# Patient Record
Sex: Female | Born: 1990 | Race: Black or African American | Hispanic: No | Marital: Single | Smoking: Never smoker
Health system: Southern US, Community
[De-identification: ages and names within clinical notes are randomized; demographics above are authoritative.]

## PROBLEM LIST (undated history)

## (undated) DIAGNOSIS — Z789 Other specified health status: Secondary | ICD-10-CM

## (undated) HISTORY — DX: Other specified health status: Z78.9

## (undated) HISTORY — PX: ADENOIDECTOMY: SUR15

---

## 2015-06-23 ENCOUNTER — Emergency Department (HOSPITAL_COMMUNITY)
Admission: EM | Admit: 2015-06-23 | Discharge: 2015-06-23 | Disposition: A | Discharge status: Home / Self Care | Payer: 59 | Attending: Emergency Medicine | Admitting: Emergency Medicine

## 2015-06-23 ENCOUNTER — Encounter (HOSPITAL_COMMUNITY): Payer: Self-pay | Admitting: Emergency Medicine

## 2015-06-23 DIAGNOSIS — O26891 Other specified pregnancy related conditions, first trimester: Secondary | ICD-10-CM | POA: Insufficient documentation

## 2015-06-23 DIAGNOSIS — R109 Unspecified abdominal pain: Secondary | ICD-10-CM | POA: Diagnosis not present

## 2015-06-23 DIAGNOSIS — Z3A01 Less than 8 weeks gestation of pregnancy: Secondary | ICD-10-CM | POA: Diagnosis not present

## 2015-06-23 DIAGNOSIS — O211 Hyperemesis gravidarum with metabolic disturbance: Secondary | ICD-10-CM | POA: Insufficient documentation

## 2015-06-23 DIAGNOSIS — O210 Mild hyperemesis gravidarum: Secondary | ICD-10-CM

## 2015-06-23 DIAGNOSIS — R1110 Vomiting, unspecified: Secondary | ICD-10-CM | POA: Diagnosis present

## 2015-06-23 DIAGNOSIS — E860 Dehydration: Secondary | ICD-10-CM

## 2015-06-23 LAB — URINALYSIS, ROUTINE W REFLEX MICROSCOPIC
BILIRUBIN URINE: NEGATIVE
GLUCOSE, UA: NEGATIVE mg/dL
HGB URINE DIPSTICK: NEGATIVE
Leukocytes, UA: NEGATIVE
Nitrite: NEGATIVE
PROTEIN: NEGATIVE mg/dL
Specific Gravity, Urine: 1.028 (ref 1.005–1.030)
pH: 6 (ref 5.0–8.0)

## 2015-06-23 LAB — COMPREHENSIVE METABOLIC PANEL
ALK PHOS: 49 U/L (ref 38–126)
ALT: 15 U/L (ref 14–54)
AST: 19 U/L (ref 15–41)
Albumin: 4.3 g/dL (ref 3.5–5.0)
Anion gap: 6 (ref 5–15)
BUN: 6 mg/dL (ref 6–20)
CALCIUM: 9.1 mg/dL (ref 8.9–10.3)
CHLORIDE: 103 mmol/L (ref 101–111)
CO2: 24 mmol/L (ref 22–32)
CREATININE: 0.61 mg/dL (ref 0.44–1.00)
Glucose, Bld: 82 mg/dL (ref 65–99)
Potassium: 3.1 mmol/L — ABNORMAL LOW (ref 3.5–5.1)
SODIUM: 133 mmol/L — AB (ref 135–145)
Total Bilirubin: 0.8 mg/dL (ref 0.3–1.2)
Total Protein: 7.6 g/dL (ref 6.5–8.1)

## 2015-06-23 LAB — CBC
HCT: 39.4 % (ref 36.0–46.0)
Hemoglobin: 13.3 g/dL (ref 12.0–15.0)
MCH: 28.1 pg (ref 26.0–34.0)
MCHC: 33.8 g/dL (ref 30.0–36.0)
MCV: 83.3 fL (ref 78.0–100.0)
PLATELETS: 250 10*3/uL (ref 150–400)
RBC: 4.73 MIL/uL (ref 3.87–5.11)
RDW: 13.7 % (ref 11.5–15.5)
WBC: 9.5 10*3/uL (ref 4.0–10.5)

## 2015-06-23 LAB — LIPASE, BLOOD: LIPASE: 14 U/L (ref 11–51)

## 2015-06-23 LAB — I-STAT BETA HCG BLOOD, ED (MC, WL, AP ONLY)

## 2015-06-23 MED ORDER — SODIUM CHLORIDE 0.9 % IV BOLUS (SEPSIS)
1000.0000 mL | Freq: Once | INTRAVENOUS | Status: AC
  Administered 2015-06-23: 1000 mL via INTRAVENOUS

## 2015-06-23 MED ORDER — ONDANSETRON HCL 4 MG PO TABS: 4.0000 mg | ORAL_TABLET | Freq: Four times a day (QID) | ORAL | Status: DC

## 2015-06-23 MED ORDER — GNP PRENATAL VITAMINS 28-0.8 MG PO TABS: 1.0000 | ORAL_TABLET | Freq: Every day | ORAL | Status: AC

## 2015-06-23 MED ORDER — ONDANSETRON HCL 4 MG/2ML IJ SOLN
4.0000 mg | Freq: Once | INTRAMUSCULAR | Status: AC
  Administered 2015-06-23: 4 mg via INTRAVENOUS
  Filled 2015-06-23: qty 2

## 2015-06-23 NOTE — ED Provider Notes (Addendum)
CSN: [id number]     Arrival date & time 06/23/15  1612 History   First MD Initiated Contact with Patient 06/23/15 1837     Chief Complaint  Patient presents with  . Emesis  . Abdominal Pain   PT IS HERE BECAUSE SHE THINKS SHE MAY BE PREGNANT.  THE PT SAID THAT HER LAST PERIOD WAS IN April.  THE PT SAID THAT SHE HAS ABD PAIN ONLY WHEN VOMITING.  THE PT DENIES ANY VAGINAL BLEEDING OR DISCHARGE.  SHE HAS NOT TAKEN ANY HOME PREGNANCY TESTS.  (Consider location/radiation/quality/duration/timing/severity/associated sxs/prior Treatment) Patient is a 25 y.o. female presenting with vomiting and abdominal pain. The history is provided by the patient.  Emesis Severity:  Moderate Timing:  Intermittent Associated symptoms: abdominal pain   Abdominal Pain Associated symptoms: vomiting     History reviewed. No pertinent past medical history. History reviewed. No pertinent past surgical history. No family history on file. Social History  Substance Use Topics  . Smoking status: Never Smoker   . Smokeless tobacco: None  . Alcohol Use: None   OB History    No data available     Review of Systems  Gastrointestinal: Positive for vomiting and abdominal pain.  All other systems reviewed and are negative.     Allergies  Review of patient's allergies indicates no known allergies.  Home Medications   Prior to Admission medications   Medication Sig Start Date End Date Taking? Authorizing Provider  ondansetron (ZOFRAN) 4 MG tablet Take 1 tablet (4 mg total) by mouth every 6 (six) hours. 06/23/15   Jacalyn LefevreJulie Shama Monfils, MD  Prenatal Vit-Fe Fumarate-FA [employer](GNP PRENATAL VITAMINS) 28-0.8 MG TABS Take 1 tablet by mouth daily. 06/23/15   Jacalyn LefevreJulie Vercie Pokorny, MD   BP 100/59 mmHg  Pulse 87  Temp(Src) 98.6 F (37 C) (Oral)  Resp 18  Ht 5' 3.75" (1.619 m)  Wt 143 lb (64.864 kg)  BMI 24.75 kg/m2  SpO2 100%  LMP 04/29/2015 Physical Exam  Constitutional: She is oriented to person, place, and time. She appears  well-developed and well-nourished.  HENT:  Head: Normocephalic and atraumatic.  Right Ear: External ear normal.  Left Ear: External ear normal.  Nose: Nose normal.  Mouth/Throat: Oropharynx is clear and moist.  Eyes: Conjunctivae and EOM are normal. Pupils are equal, round, and reactive to light.  Neck: Normal range of motion. Neck supple.  Cardiovascular: Normal rate, regular rhythm, normal heart sounds and intact distal pulses.   Pulmonary/Chest: Effort normal and breath sounds normal.  Abdominal: Soft. Bowel sounds are normal.  Musculoskeletal: Normal range of motion.  Neurological: She is alert and oriented to person, place, and time.  Skin: Skin is warm and dry.  Psychiatric: She has a normal mood and affect. Her behavior is normal. Judgment and thought content normal.  Nursing note and vitals reviewed.   ED Course  Procedures (including critical care time) Labs Review Labs Reviewed  COMPREHENSIVE METABOLIC PANEL - Abnormal; Notable for the following:    Sodium 133 (*)    Potassium 3.1 (*)    All other components within normal limits  URINALYSIS, ROUTINE W REFLEX MICROSCOPIC (NOT AT [facility]) - Abnormal; Notable for the following:    APPearance CLOUDY (*)    Ketones, ur >80 (*)    All other components within normal limits  I-STAT BETA HCG BLOOD, ED ([facility], [facility], [facility] ONLY) - Abnormal; Notable for the following:    I-stat hCG, quantitative >2000.0 (*)    All other components within normal limits  LIPASE, BLOOD  CBC    Imaging Review No results found. I have personally reviewed and evaluated these images and lab results as part of my medical decision-making.   EKG Interpretation None      MDM  PT IS FEELING MUCH BETTER.  SHE KNOWS TO RETURN IF WORSE.  SHE IS GIVEN A RX FOR PNV AND ZOFRAN.  SHE IS INSTR TO F/U WITH GYN. Final diagnoses:  Hyperemesis gravidarum  Dehydration      Jacalyn Lefevre, MD 06/23/15 2011  Jacalyn Lefevre, MD 07/03/15 [phone]

## 2015-06-23 NOTE — Discharge Instructions (Signed)
Hyperemesis Gravidarum  Hyperemesis gravidarum is a severe form of nausea and vomiting that happens during pregnancy. Hyperemesis is worse than morning sickness. It may cause you to have nausea or vomiting all day for many days. It may keep you from eating and drinking enough food and liquids. Hyperemesis usually occurs during the first half (the first 20 weeks) of pregnancy. It often goes away once a woman is in her second half of pregnancy. However, sometimes hyperemesis continues through an entire pregnancy.   CAUSES   The cause of this condition is not completely known but is thought to be related to changes in the body's hormones when pregnant. It could be from the high level of the pregnancy hormone or an increase in estrogen in the body.   SIGNS AND SYMPTOMS    Severe nausea and vomiting.   Nausea that does not go away.   Vomiting that does not allow you to keep any food down.   Weight loss and body fluid loss (dehydration).   Having no desire to eat or not liking food you have previously enjoyed.  DIAGNOSIS   Your health care provider will do a physical exam and ask you about your symptoms. He or she may also order blood tests and urine tests to make sure something else is not causing the problem.   TREATMENT   You may only need medicine to control the problem. If medicines do not control the nausea and vomiting, you will be treated in the hospital to prevent dehydration, increased acid in the blood (acidosis), weight loss, and changes in the electrolytes in your body that may harm the unborn baby (fetus). You may need IV fluids.   HOME CARE INSTRUCTIONS    Only take over-the-counter or prescription medicines as directed by your health care provider.   Try eating a couple of dry crackers or toast in the morning before getting out of bed.   Avoid foods and smells that upset your stomach.   Avoid fatty and spicy foods.   Eat 5-6 small meals a day.   Do not drink when eating meals. Drink between  meals.   For snacks, eat high-protein foods, such as cheese.   Eat or suck on things that have ginger in them. Ginger helps nausea.   Avoid food preparation. The smell of food can spoil your appetite.   Avoid iron pills and iron in your multivitamins until after 3-4 months of being pregnant. However, consult with your health care provider before stopping any prescribed iron pills.  SEEK MEDICAL CARE IF:    Your abdominal pain increases.   You have a severe headache.   You have vision problems.   You are losing weight.  SEEK IMMEDIATE MEDICAL CARE IF:    You are unable to keep fluids down.   You vomit blood.   You have constant nausea and vomiting.   You have excessive weakness.   You have extreme thirst.   You have dizziness or fainting.   You have a fever or persistent symptoms for more than 2-3 days.   You have a fever and your symptoms suddenly get worse.  MAKE SURE YOU:    Understand these instructions.   Will watch your condition.   Will get help right away if you are not doing well or get worse.     This information is not intended to replace advice given to you by your health care provider. Make sure you discuss any questions you have with   your health care provider.     Document Released: 12/31/2004 Document Revised: 10/21/2012 Document Reviewed: 08/12/2012  [employer]vier Interactive Patient Education 2016 [employer]

## 2015-06-23 NOTE — ED Notes (Signed)
Pt c/o slight abdominal pain and episodes of emesis for approx 2 weeks. Pt states she missed her last period and thinks she could be pregnant.

## 2015-09-13 ENCOUNTER — Encounter: Payer: Self-pay | Admitting: Obstetrics and Gynecology

## 2015-09-13 ENCOUNTER — Ambulatory Visit (INDEPENDENT_AMBULATORY_CARE_PROVIDER_SITE_OTHER): Payer: 59 | Admitting: Obstetrics and Gynecology

## 2015-09-13 DIAGNOSIS — Z3482 Encounter for supervision of other normal pregnancy, second trimester: Secondary | ICD-10-CM

## 2015-09-13 DIAGNOSIS — O0930 Supervision of pregnancy with insufficient antenatal care, unspecified trimester: Secondary | ICD-10-CM | POA: Insufficient documentation

## 2015-09-13 DIAGNOSIS — O0932 Supervision of pregnancy with insufficient antenatal care, second trimester: Secondary | ICD-10-CM | POA: Diagnosis not present

## 2015-09-13 DIAGNOSIS — Z3490 Encounter for supervision of normal pregnancy, unspecified, unspecified trimester: Secondary | ICD-10-CM | POA: Insufficient documentation

## 2015-09-13 NOTE — Progress Notes (Signed)
Patient concerned about nipple piercing and breast feeding. Pt. States she needs dental letter, frequent restroom break note for work.

## 2015-09-13 NOTE — Progress Notes (Signed)
  Subjective:    Anna Bean is a G2P0010 at 1940w3d by exact LMP being seen today for her first obstetrical visit.  Her obstetrical history is significant for first pregnancy and late onset to care at 20 weeks. Patient does intend to breast feed. Pregnancy history fully reviewed.  Patient reports no complaints.  Vitals:   09/13/15 1415  BP: 114/74  Pulse: 85  Temp: 98.1 F (36.7 C)  Weight: 133 lb 11.2 oz (60.6 kg)    HISTORY: OB History  Gravida Para Term Preterm AB Living  2       1    SAB TAB Ectopic Multiple Live Births    1          # Outcome Date GA Lbr Len/2nd Weight Sex Delivery Anes PTL Lv  2 Current           1 TAB 06/2012             Past Medical History:  Diagnosis Date  . Medical history non-contributory    Past Surgical History:  Procedure Laterality Date  . ADENOIDECTOMY     Family History  Problem Relation Age of Onset  . Hypertension Mother   . Hypokalemia Mother      Exam    Uterus:     Pelvic Exam:    Perineum: No Hemorrhoids, Normal Perineum   Vulva: normal   Vagina:  normal mucosa, normal discharge   pH:    Cervix: nulliparous appearance and cervix is closed and long   Adnexa: no mass, fullness, tenderness   Bony Pelvis: gynecoid  System: Breast:  normal appearance, no masses or tenderness   Skin: normal coloration and turgor, no rashes    Neurologic: oriented, no focal deficits   Extremities: normal strength, tone, and muscle mass   HEENT extra ocular movement intact   Mouth/Teeth mucous membranes moist, pharynx normal without lesions and dental hygiene good   Neck supple and no masses   Cardiovascular: regular rate and rhythm   Respiratory:  chest clear, no wheezing, crepitations, rhonchi, normal symmetric air entry   Abdomen: soft, non-tender; bowel sounds normal; no masses,  no organomegaly   Urinary:       Assessment:    Pregnancy: G2P0010 Patient Active Problem List   Diagnosis Date Noted  . Supervision of normal  pregnancy, antepartum 09/13/2015  . Insufficient antepartum care 09/13/2015        Plan:     Initial labs drawn. Prenatal vitamins. Problem list reviewed and updated. Genetic Screening discussed Quad Screen: ordered.  Ultrasound discussed; fetal survey: ordered. Pap smear record requested. Patient states it was normal in 2016  Follow up in 4 weeks. 50% of 30 min visit spent on counseling and coordination of care.     Anna Bean 09/13/2015

## 2015-09-13 NOTE — Patient Instructions (Addendum)
Second Trimester of Pregnancy The second trimester is from week 13 through week 28, months 4 through 6. The second trimester is often a time when you feel your best. Your body has also adjusted to being pregnant, and you begin to feel better physically. Usually, morning sickness has lessened or quit completely, you may have more energy, and you may have an increase in appetite. The second trimester is also a time when the fetus is growing rapidly. At the end of the sixth month, the fetus is about 9 inches long and weighs about 1 pounds. You will likely begin to feel the baby move (quickening) between 18 and 20 weeks of the pregnancy. BODY CHANGES Your body goes through many changes during pregnancy. The changes vary from woman to woman.   Your weight will continue to increase. You will notice your lower abdomen bulging out.  You may begin to get stretch marks on your hips, abdomen, and breasts.  You may develop headaches that can be relieved by medicines approved by your health care provider.  You may urinate more often because the fetus is pressing on your bladder.  You may develop or continue to have heartburn as a result of your pregnancy.  You may develop constipation because certain hormones are causing the muscles that push waste through your intestines to slow down.  You may develop hemorrhoids or swollen, bulging veins (varicose veins).  You may have back pain because of the weight gain and pregnancy hormones relaxing your joints between the bones in your pelvis and as a result of a shift in weight and the muscles that support your balance.  Your breasts will continue to grow and be tender.  Your gums may bleed and may be sensitive to brushing and flossing.  Dark spots or blotches (chloasma, mask of pregnancy) may develop on your face. This will likely fade after the baby is born.  A dark line from your belly button to the pubic area (linea nigra) may appear. This will likely  fade after the baby is born.  You may have changes in your hair. These can include thickening of your hair, rapid growth, and changes in texture. Some women also have hair loss during or after pregnancy, or hair that feels dry or thin. Your hair will most likely return to normal after your baby is born. WHAT TO EXPECT AT YOUR PRENATAL VISITS During a routine prenatal visit:  You will be weighed to make sure you and the fetus are growing normally.  Your blood pressure will be taken.  Your abdomen will be measured to track your baby's growth.  The fetal heartbeat will be listened to.  Any test results from the previous visit will be discussed. Your health care provider may ask you:  How you are feeling.  If you are feeling the baby move.  If you have had any abnormal symptoms, such as leaking fluid, bleeding, severe headaches, or abdominal cramping.  If you are using any tobacco products, including cigarettes, chewing tobacco, and electronic cigarettes.  If you have any questions. Other tests that may be performed during your second trimester include:  Blood tests that check for:  Low iron levels (anemia).  Gestational diabetes (between 24 and 28 weeks).  Rh antibodies.  Urine tests to check for infections, diabetes, or protein in the urine.  An ultrasound to confirm the proper growth and development of the baby.  An amniocentesis to check for possible genetic problems.  Fetal screens for spina bifida   and Down syndrome.  HIV (human immunodeficiency virus) testing. Routine prenatal testing includes screening for HIV, unless you choose not to have this test. HOME CARE INSTRUCTIONS   Avoid all smoking, herbs, alcohol, and unprescribed drugs. These chemicals affect the formation and growth of the baby.  Do not use any tobacco products, including cigarettes, chewing tobacco, and electronic cigarettes. If you need help quitting, ask your health care provider. You may receive  counseling support and other resources to help you quit.  Follow your health care provider's instructions regarding medicine use. There are medicines that are either safe or unsafe to take during pregnancy.  Exercise only as directed by your health care provider. Experiencing uterine cramps is a good sign to stop exercising.  Continue to eat regular, healthy meals.  Wear a good support bra for breast tenderness.  Do not use hot tubs, steam rooms, or saunas.  Wear your seat belt at all times when driving.  Avoid raw meat, uncooked cheese, cat litter boxes, and soil used by cats. These carry germs that can cause birth defects in the baby.  Take your prenatal vitamins.  Take 1500-2000 mg of calcium daily starting at the 20th week of pregnancy until you deliver your baby.  Try taking a stool softener (if your health care provider approves) if you develop constipation. Eat more high-fiber foods, such as fresh vegetables or fruit and whole grains. Drink plenty of fluids to keep your urine clear or pale yellow.  Take warm sitz baths to soothe any pain or discomfort caused by hemorrhoids. Use hemorrhoid cream if your health care provider approves.  If you develop varicose veins, wear support hose. Elevate your feet for 15 minutes, 3-4 times a day. Limit salt in your diet.  Avoid heavy lifting, wear low heel shoes, and practice good posture.  Rest with your legs elevated if you have leg cramps or low back pain.  Visit your dentist if you have not gone yet during your pregnancy. Use a soft toothbrush to brush your teeth and be gentle when you floss.  A sexual relationship may be continued unless your health care provider directs you otherwise.  Continue to go to all your prenatal visits as directed by your health care provider. SEEK MEDICAL CARE IF:   You have dizziness.  You have mild pelvic cramps, pelvic pressure, or nagging pain in the abdominal area.  You have persistent nausea,  vomiting, or diarrhea.  You have a bad smelling vaginal discharge.  You have pain with urination. SEEK IMMEDIATE MEDICAL CARE IF:   You have a fever.  You are leaking fluid from your vagina.  You have spotting or bleeding from your vagina.  You have severe abdominal cramping or pain.  You have rapid weight gain or loss.  You have shortness of breath with chest pain.  You notice sudden or extreme swelling of your face, hands, ankles, feet, or legs.  You have not felt your baby move in over an hour.  You have severe headaches that do not go away with medicine.  You have vision changes.   This information is not intended to replace advice given to you by your health care provider. Make sure you discuss any questions you have with your health care provider.   Document Released: 12/25/2000 Document Revised: 01/21/2014 Document Reviewed: 03/03/2012 Elsevier Interactive Patient Education 2016 [employer]  Contraception Choices Contraception (birth control) is the use of any methods or devices to prevent pregnancy. Below are some methods to   help avoid pregnancy. HORMONAL METHODS   Contraceptive implant. This is a thin, plastic tube containing progesterone hormone. It does not contain estrogen hormone. Your health care provider inserts the tube in the inner part of the upper arm. The tube can remain in place for up to 3 years. After 3 years, the implant must be removed. The implant prevents the ovaries from releasing an egg (ovulation), thickens the cervical mucus to prevent sperm from entering the uterus, and thins the lining of the inside of the uterus.  Progesterone-only injections. These injections are given every 3 months by your health care provider to prevent pregnancy. This synthetic progesterone hormone stops the ovaries from releasing eggs. It also thickens cervical mucus and changes the uterine lining. This makes it harder for sperm to survive in the uterus.  Birth  control pills. These pills contain estrogen and progesterone hormone. They work by preventing the ovaries from releasing eggs (ovulation). They also cause the cervical mucus to thicken, preventing the sperm from entering the uterus. Birth control pills are prescribed by a health care provider.Birth control pills can also be used to treat heavy periods.  Minipill. This type of birth control pill contains only the progesterone hormone. They are taken every day of each month and must be prescribed by your health care provider.  Birth control patch. The patch contains hormones similar to those in birth control pills. It must be changed once a week and is prescribed by a health care provider.  Vaginal ring. The ring contains hormones similar to those in birth control pills. It is left in the vagina for 3 weeks, removed for 1 week, and then a new one is put back in place. The patient must be comfortable inserting and removing the ring from the vagina.A health care provider's prescription is necessary.  Emergency contraception. Emergency contraceptives prevent pregnancy after unprotected sexual intercourse. This pill can be taken right after sex or up to 5 days after unprotected sex. It is most effective the sooner you take the pills after having sexual intercourse. Most emergency contraceptive pills are available without a prescription. Check with your pharmacist. Do not use emergency contraception as your only form of birth control. BARRIER METHODS   Female condom. This is a thin sheath (latex or rubber) that is worn over the penis during sexual intercourse. It can be used with spermicide to increase effectiveness.  Female condom. This is a soft, loose-fitting sheath that is put into the vagina before sexual intercourse.  Diaphragm. This is a soft, latex, dome-shaped barrier that must be fitted by a health care provider. It is inserted into the vagina, along with a spermicidal jelly. It is inserted before  intercourse. The diaphragm should be left in the vagina for 6 to 8 hours after intercourse.  Cervical cap. This is a round, soft, latex or plastic cup that fits over the cervix and must be fitted by a health care provider. The cap can be left in place for up to 48 hours after intercourse.  Sponge. This is a soft, circular piece of polyurethane foam. The sponge has spermicide in it. It is inserted into the vagina after wetting it and before sexual intercourse.  Spermicides. These are chemicals that kill or block sperm from entering the cervix and uterus. They come in the form of creams, jellies, suppositories, foam, or tablets. They do not require a prescription. They are inserted into the vagina with an applicator before having sexual intercourse. The process must be repeated every   time you have sexual intercourse. INTRAUTERINE CONTRACEPTION  Intrauterine device (IUD). This is a T-shaped device that is put in a woman's uterus during a menstrual period to prevent pregnancy. There are 2 types:  Copper IUD. This type of IUD is wrapped in copper wire and is placed inside the uterus. Copper makes the uterus and fallopian tubes produce a fluid that kills sperm. It can stay in place for 10 years.  Hormone IUD. This type of IUD contains the hormone progestin (synthetic progesterone). The hormone thickens the cervical mucus and prevents sperm from entering the uterus, and it also thins the uterine lining to prevent implantation of a fertilized egg. The hormone can weaken or kill the sperm that get into the uterus. It can stay in place for 3-5 years, depending on which type of IUD is used. PERMANENT METHODS OF CONTRACEPTION  Female tubal ligation. This is when the woman's fallopian tubes are surgically sealed, tied, or blocked to prevent the egg from traveling to the uterus.  Hysteroscopic sterilization. This involves placing a small coil or insert into each fallopian tube. Your doctor uses a technique  called hysteroscopy to do the procedure. The device causes scar tissue to form. This results in permanent blockage of the fallopian tubes, so the sperm cannot fertilize the egg. It takes about 3 months after the procedure for the tubes to become blocked. You must use another form of birth control for these 3 months.  Female sterilization. This is when the female has the tubes that carry sperm tied off (vasectomy).This blocks sperm from entering the vagina during sexual intercourse. After the procedure, the man can still ejaculate fluid (semen). NATURAL PLANNING METHODS  Natural family planning. This is not having sexual intercourse or using a barrier method (condom, diaphragm, cervical cap) on days the woman could become pregnant.  Calendar method. This is keeping track of the length of each menstrual cycle and identifying when you are fertile.  Ovulation method. This is avoiding sexual intercourse during ovulation.  Symptothermal method. This is avoiding sexual intercourse during ovulation, using a thermometer and ovulation symptoms.  Post-ovulation method. This is timing sexual intercourse after you have ovulated. Regardless of which type or method of contraception you choose, it is important that you use condoms to protect against the transmission of sexually transmitted infections (STIs). Talk with your health care provider about which form of contraception is most appropriate for you.   This information is not intended to replace advice given to you by your health care provider. Make sure you discuss any questions you have with your health care provider.   Document Released: 12/31/2004 Document Revised: 01/05/2013 Document Reviewed: 06/25/2012 Elsevier Interactive Patient Education 2016 [employer]  Breastfeeding Deciding to breastfeed is one of the best choices you can make for you and your baby. A change in hormones during pregnancy causes your breast tissue to grow and increases the  number and size of your milk ducts. These hormones also allow proteins, sugars, and fats from your blood supply to make breast milk in your milk-producing glands. Hormones prevent breast milk from being released before your baby is born as well as prompt milk flow after birth. Once breastfeeding has begun, thoughts of your baby, as well as his or her sucking or crying, can stimulate the release of milk from your milk-producing glands.  BENEFITS OF BREASTFEEDING For Your Baby  Your first milk (colostrum) helps your baby's digestive system function better.  There are antibodies in your milk that help   your baby fight off infections.  Your baby has a lower incidence of asthma, allergies, and sudden infant death syndrome.  The nutrients in breast milk are better for your baby than infant formulas and are designed uniquely for your baby's needs.  Breast milk improves your baby's brain development.  Your baby is less likely to develop other conditions, such as childhood obesity, asthma, or type 2 diabetes mellitus. For You  Breastfeeding helps to create a very special bond between you and your baby.  Breastfeeding is convenient. Breast milk is always available at the correct temperature and costs nothing.  Breastfeeding helps to burn calories and helps you lose the weight gained during pregnancy.  Breastfeeding makes your uterus contract to its prepregnancy size faster and slows bleeding (lochia) after you give birth.   Breastfeeding helps to lower your risk of developing type 2 diabetes mellitus, osteoporosis, and breast or ovarian cancer later in life. SIGNS THAT YOUR BABY IS HUNGRY Early Signs of Hunger  Increased alertness or activity.  Stretching.  Movement of the head from side to side.  Movement of the head and opening of the mouth when the corner of the mouth or cheek is stroked (rooting).  Increased sucking sounds, smacking lips, cooing, sighing, or squeaking.  Hand-to-mouth  movements.  Increased sucking of fingers or hands. Late Signs of Hunger  Fussing.  Intermittent crying. Extreme Signs of Hunger Signs of extreme hunger will require calming and consoling before your baby will be able to breastfeed successfully. Do not wait for the following signs of extreme hunger to occur before you initiate breastfeeding:  Restlessness.  A loud, strong cry.  Screaming. BREASTFEEDING BASICS Breastfeeding Initiation  Find a comfortable place to sit or lie down, with your neck and back well supported.  Place a pillow or rolled up blanket under your baby to bring him or her to the level of your breast (if you are seated). Nursing pillows are specially designed to help support your arms and your baby while you breastfeed.  Make sure that your baby's abdomen is facing your abdomen.  Gently massage your breast. With your fingertips, massage from your chest wall toward your nipple in a circular motion. This encourages milk flow. You may need to continue this action during the feeding if your milk flows slowly.  Support your breast with 4 fingers underneath and your thumb above your nipple. Make sure your fingers are well away from your nipple and your baby's mouth.  Stroke your baby's lips gently with your finger or nipple.  When your baby's mouth is open wide enough, quickly bring your baby to your breast, placing your entire nipple and as much of the colored area around your nipple (areola) as possible into your baby's mouth.  More areola should be visible above your baby's upper lip than below the lower lip.  Your baby's tongue should be between his or her lower gum and your breast.  Ensure that your baby's mouth is correctly positioned around your nipple (latched). Your baby's lips should create a seal on your breast and be turned out (everted).  It is common for your baby to suck about 2-3 minutes in order to start the flow of breast milk. Latching Teaching  your baby how to latch on to your breast properly is very important. An improper latch can cause nipple pain and decreased milk supply for you and poor weight gain in your baby. Also, if your baby is not latched onto your nipple properly, he   or she may swallow some air during feeding. This can make your baby fussy. Burping your baby when you switch breasts during the feeding can help to get rid of the air. However, teaching your baby to latch on properly is still the best way to prevent fussiness from swallowing air while breastfeeding. Signs that your baby has successfully latched on to your nipple:  Silent tugging or silent sucking, without causing you pain.  Swallowing heard between every 3-4 sucks.  Muscle movement above and in front of his or her ears while sucking. Signs that your baby has not successfully latched on to nipple:  Sucking sounds or smacking sounds from your baby while breastfeeding.  Nipple pain. If you think your baby has not latched on correctly, slip your finger into the corner of your baby's mouth to break the suction and place it between your baby's gums. Attempt breastfeeding initiation again. Signs of Successful Breastfeeding Signs from your baby:  A gradual decrease in the number of sucks or complete cessation of sucking.  Falling asleep.  Relaxation of his or her body.  Retention of a small amount of milk in his or her mouth.  Letting go of your breast by himself or herself. Signs from you:  Breasts that have increased in firmness, weight, and size 1-3 hours after feeding.  Breasts that are softer immediately after breastfeeding.  Increased milk volume, as well as a change in milk consistency and color by the fifth day of breastfeeding.  Nipples that are not sore, cracked, or bleeding. Signs That Your Baby is Getting Enough Milk  Wetting at least 3 diapers in a 24-hour period. The urine should be clear and pale yellow by age 5 days.  At least 3  stools in a 24-hour period by age 5 days. The stool should be soft and yellow.  At least 3 stools in a 24-hour period by age 7 days. The stool should be seedy and yellow.  No loss of weight greater than 10% of birth weight during the first 3 days of age.  Average weight gain of 4-7 ounces (113-198 g) per week after age 4 days.  Consistent daily weight gain by age 5 days, without weight loss after the age of 2 weeks. After a feeding, your baby may spit up a small amount. This is common. BREASTFEEDING FREQUENCY AND DURATION Frequent feeding will help you make more milk and can prevent sore nipples and breast engorgement. Breastfeed when you feel the need to reduce the fullness of your breasts or when your baby shows signs of hunger. This is called "breastfeeding on demand." Avoid introducing a pacifier to your baby while you are working to establish breastfeeding (the first 4-6 weeks after your baby is born). After this time you may choose to use a pacifier. Research has shown that pacifier use during the first year of a baby's life decreases the risk of sudden infant death syndrome (SIDS). Allow your baby to feed on each breast as long as he or she wants. Breastfeed until your baby is finished feeding. When your baby unlatches or falls asleep while feeding from the first breast, offer the second breast. Because newborns are often sleepy in the first few weeks of life, you may need to awaken your baby to get him or her to feed. Breastfeeding times will vary from baby to baby. However, the following rules can serve as a guide to help you ensure that your baby is properly fed:  Newborns (babies 4 weeks   of age or younger) may breastfeed every 1-3 hours.  Newborns should not go longer than 3 hours during the day or 5 hours during the night without breastfeeding.  You should breastfeed your baby a minimum of 8 times in a 24-hour period until you begin to introduce solid foods to your baby at around 6  months of age. BREAST MILK PUMPING Pumping and storing breast milk allows you to ensure that your baby is exclusively fed your breast milk, even at times when you are unable to breastfeed. This is especially important if you are going back to work while you are still breastfeeding or when you are not able to be present during feedings. Your lactation consultant can give you guidelines on how long it is safe to store breast milk. A breast pump is a machine that allows you to pump milk from your breast into a sterile bottle. The pumped breast milk can then be stored in a refrigerator or freezer. Some breast pumps are operated by hand, while others use electricity. Ask your lactation consultant which type will work best for you. Breast pumps can be purchased, but some hospitals and breastfeeding support groups lease breast pumps on a monthly basis. A lactation consultant can teach you how to hand express breast milk, if you prefer not to use a pump. CARING FOR YOUR BREASTS WHILE YOU BREASTFEED Nipples can become dry, cracked, and sore while breastfeeding. The following recommendations can help keep your breasts moisturized and healthy:  Avoid using soap on your nipples.  Wear a supportive bra. Although not required, special nursing bras and tank tops are designed to allow access to your breasts for breastfeeding without taking off your entire bra or top. Avoid wearing underwire-style bras or extremely tight bras.  Air dry your nipples for 3-4minutes after each feeding.  Use only cotton bra pads to absorb leaked breast milk. Leaking of breast milk between feedings is normal.  Use lanolin on your nipples after breastfeeding. Lanolin helps to maintain your skin's normal moisture barrier. If you use pure lanolin, you do not need to wash it off before feeding your baby again. Pure lanolin is not toxic to your baby. You may also hand express a few drops of breast milk and gently massage that milk into your  nipples and allow the milk to air dry. In the first few weeks after giving birth, some women experience extremely full breasts (engorgement). Engorgement can make your breasts feel heavy, warm, and tender to the touch. Engorgement peaks within 3-5 days after you give birth. The following recommendations can help ease engorgement:  Completely empty your breasts while breastfeeding or pumping. You may want to start by applying warm, moist heat (in the shower or with warm water-soaked hand towels) just before feeding or pumping. This increases circulation and helps the milk flow. If your baby does not completely empty your breasts while breastfeeding, pump any extra milk after he or she is finished.  Wear a snug bra (nursing or regular) or tank top for 1-2 days to signal your body to slightly decrease milk production.  Apply ice packs to your breasts, unless this is too uncomfortable for you.  Make sure that your baby is latched on and positioned properly while breastfeeding. If engorgement persists after 48 hours of following these recommendations, contact your health care provider or a lactation consultant. OVERALL HEALTH CARE RECOMMENDATIONS WHILE BREASTFEEDING  Eat healthy foods. Alternate between meals and snacks, eating 3 of each per day. Because what   you eat affects your breast milk, some of the foods may make your baby more irritable than usual. Avoid eating these foods if you are sure that they are negatively affecting your baby.  Drink milk, fruit juice, and water to satisfy your thirst (about 10 glasses a day).  Rest often, relax, and continue to take your prenatal vitamins to prevent fatigue, stress, and anemia.  Continue breast self-awareness checks.  Avoid chewing and smoking tobacco. Chemicals from cigarettes that pass into breast milk and exposure to secondhand smoke may harm your baby.  Avoid alcohol and drug use, including marijuana. Some medicines that may be harmful to your  baby can pass through breast milk. It is important to ask your health care provider before taking any medicine, including all over-the-counter and prescription medicine as well as vitamin and herbal supplements. It is possible to become pregnant while breastfeeding. If birth control is desired, ask your health care provider about options that will be safe for your baby. SEEK MEDICAL CARE IF:  You feel like you want to stop breastfeeding or have become frustrated with breastfeeding.  You have painful breasts or nipples.  Your nipples are cracked or bleeding.  Your breasts are red, tender, or warm.  You have a swollen area on either breast.  You have a fever or chills.  You have nausea or vomiting.  You have drainage other than breast milk from your nipples.  Your breasts do not become full before feedings by the fifth day after you give birth.  You feel sad and depressed.  Your baby is too sleepy to eat well.  Your baby is having trouble sleeping.   Your baby is wetting less than 3 diapers in a 24-hour period.  Your baby has less than 3 stools in a 24-hour period.  Your baby's skin or the white part of his or her eyes becomes yellow.   Your baby is not gaining weight by 5 days of age. SEEK IMMEDIATE MEDICAL CARE IF:  Your baby is overly tired (lethargic) and does not want to wake up and feed.  Your baby develops an unexplained fever.   This information is not intended to replace advice given to you by your health care provider. Make sure you discuss any questions you have with your health care provider.   Document Released: 12/31/2004 Document Revised: 09/21/2014 Document Reviewed: 06/24/2012 Elsevier Interactive Patient Education 2016 Elsevier Inc.  

## 2015-09-15 LAB — URINE CULTURE, OB REFLEX

## 2015-09-15 LAB — GC/CHLAMYDIA PROBE AMP
Chlamydia trachomatis, NAA: NEGATIVE
Neisseria gonorrhoeae by PCR: NEGATIVE

## 2015-09-15 LAB — CULTURE, OB URINE

## 2015-09-21 LAB — AFP, QUAD SCREEN
DIA MOM VALUE: 0.98
DIA VALUE (EIA): 217.4 pg/mL
DSR (BY AGE) 1 IN: 1094
DSR (Second Trimester) 1 IN: 7612
GESTATIONAL AGE AFP: 20.4 wk
MATERNAL AGE AT EDD: 23.3 a
MSAFP Mom: 0.83
MSAFP: 59.2 ng/mL
MSHCG MOM: 0.87
MSHCG: 22098 m[IU]/mL
Osb Risk: 10000
T18 (By Age): 1:4262 {titer}
Test Results:: NEGATIVE
UE3 MOM: 1.19
UE3 VALUE: 2.44 ng/mL
Weight: 134 [lb_av]

## 2015-09-23 LAB — HIV ANTIBODY (ROUTINE TESTING W REFLEX): HIV SCREEN 4TH GENERATION: NONREACTIVE

## 2015-09-23 LAB — PRENATAL PROFILE I(LABCORP)
Antibody Screen: NEGATIVE
BASOS ABS: 0 10*3/uL (ref 0.0–0.2)
BASOS: 0 %
EOS (ABSOLUTE): 0 10*3/uL (ref 0.0–0.4)
EOS: 0 %
HEMATOCRIT: 34.7 % (ref 34.0–46.6)
Hemoglobin: 11.8 g/dL (ref 11.1–15.9)
Hepatitis B Surface Ag: NEGATIVE
IMMATURE GRANS (ABS): 0 10*3/uL (ref 0.0–0.1)
Immature Granulocytes: 0 %
LYMPHS: 20 %
Lymphocytes Absolute: 1.4 10*3/uL (ref 0.7–3.1)
MCH: 29.6 pg (ref 26.6–33.0)
MCHC: 34 g/dL (ref 31.5–35.7)
MCV: 87 fL (ref 79–97)
MONOCYTES: 7 %
Monocytes Absolute: 0.5 10*3/uL (ref 0.1–0.9)
NEUTROS ABS: 4.9 10*3/uL (ref 1.4–7.0)
Neutrophils: 73 %
PLATELETS: 261 10*3/uL (ref 150–379)
RBC: 3.98 x10E6/uL (ref 3.77–5.28)
RDW: 14.4 % (ref 12.3–15.4)
RH TYPE: POSITIVE
RPR Ser Ql: NONREACTIVE
Rubella Antibodies, IGG: 2.28 index (ref >0.99)
WBC: 6.8 10*3/uL (ref 3.4–10.8)

## 2015-09-23 LAB — HEMOGLOBINOPATHY EVALUATION
HGB A: 97.1 % (ref 94.0–98.0)
HGB C: 0 %
HGB S: 0 %
Hemoglobin A2 Quantitation: 2.2 % (ref 0.7–3.1)
Hemoglobin F Quantitation: 0.7 % (ref 0.0–2.0)

## 2015-09-23 LAB — TOXASSURE SELECT 13 (MW), URINE

## 2015-09-23 LAB — CYSTIC FIBROSIS MUTATION 97: GENE DIS ANAL CARRIER INTERP BLD/T-IMP: NOT DETECTED

## 2015-09-23 LAB — VARICELLA ZOSTER ANTIBODY, IGG: VARICELLA: 1009 {index} (ref >165)

## 2015-09-27 ENCOUNTER — Encounter (HOSPITAL_COMMUNITY): Payer: Self-pay | Admitting: Obstetrics and Gynecology

## 2015-10-06 ENCOUNTER — Other Ambulatory Visit: Payer: Self-pay | Admitting: Obstetrics and Gynecology

## 2015-10-06 ENCOUNTER — Ambulatory Visit (HOSPITAL_COMMUNITY)
Admission: RE | Admit: 2015-10-06 | Discharge: 2015-10-06 | Disposition: A | Discharge status: Home / Self Care | Payer: 59 | Source: Ambulatory Visit | Attending: Obstetrics and Gynecology | Admitting: Obstetrics and Gynecology

## 2015-10-06 DIAGNOSIS — Z3482 Encounter for supervision of other normal pregnancy, second trimester: Secondary | ICD-10-CM

## 2015-10-06 DIAGNOSIS — Z3A22 22 weeks gestation of pregnancy: Secondary | ICD-10-CM

## 2015-10-06 DIAGNOSIS — Z3492 Encounter for supervision of normal pregnancy, unspecified, second trimester: Secondary | ICD-10-CM

## 2015-10-06 DIAGNOSIS — Z3A23 23 weeks gestation of pregnancy: Secondary | ICD-10-CM

## 2015-10-06 DIAGNOSIS — Z1389 Encounter for screening for other disorder: Secondary | ICD-10-CM

## 2015-10-06 DIAGNOSIS — Z36 Encounter for antenatal screening of mother: Secondary | ICD-10-CM | POA: Diagnosis present

## 2015-10-06 IMAGING — US US MFM OB COMP +14 WKS
1 series · 14 of 28 positions shown · non-contrast
Comparison: none

[Series 1: us mfm ob comp +14 wks · 86 acquisitions, 14 frames shown]
[im 4/86]
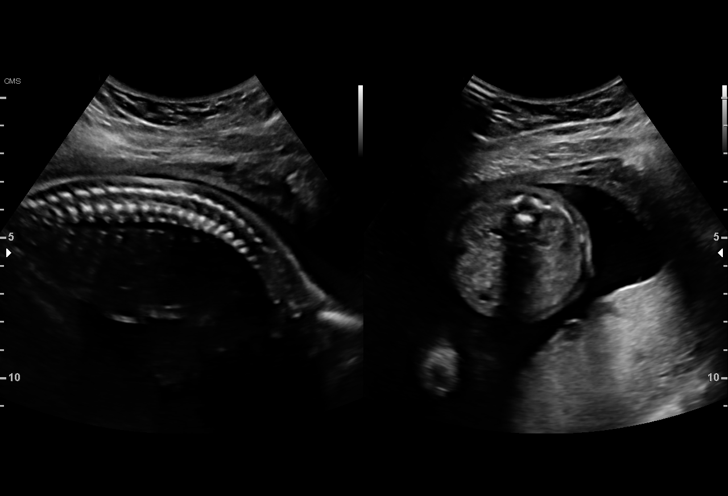
[im 10/86]
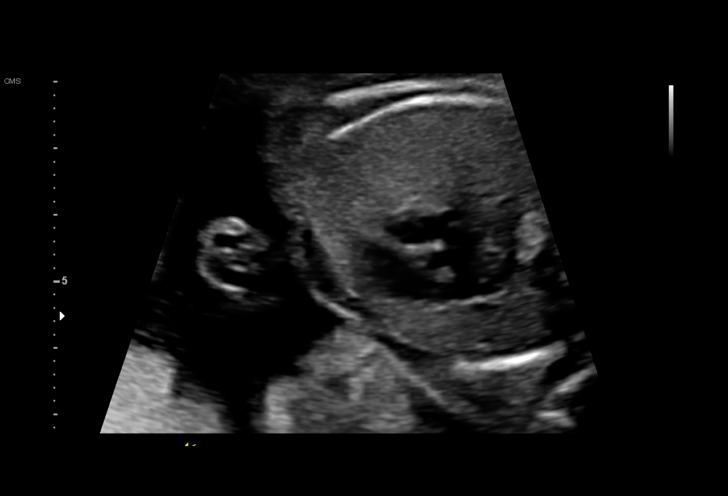
[im 16/86]
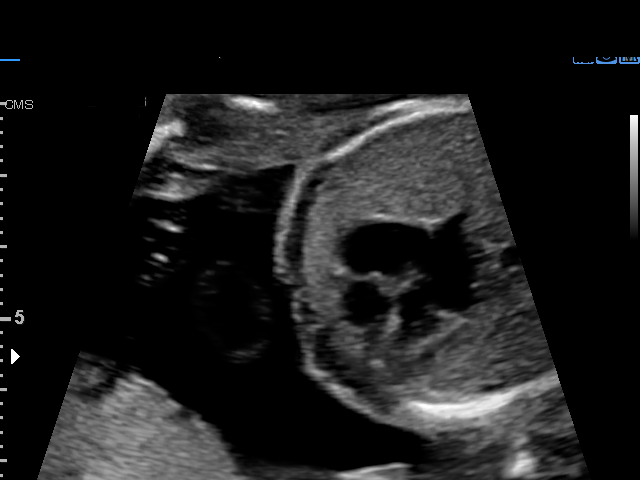
[im 23/86]
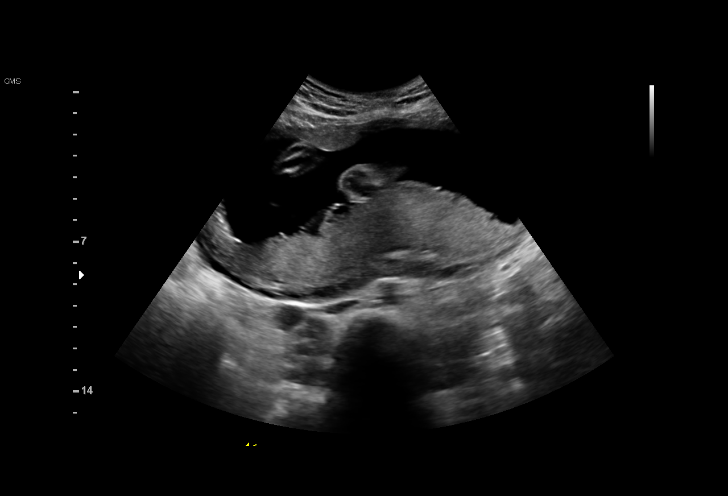
[im 29/86]
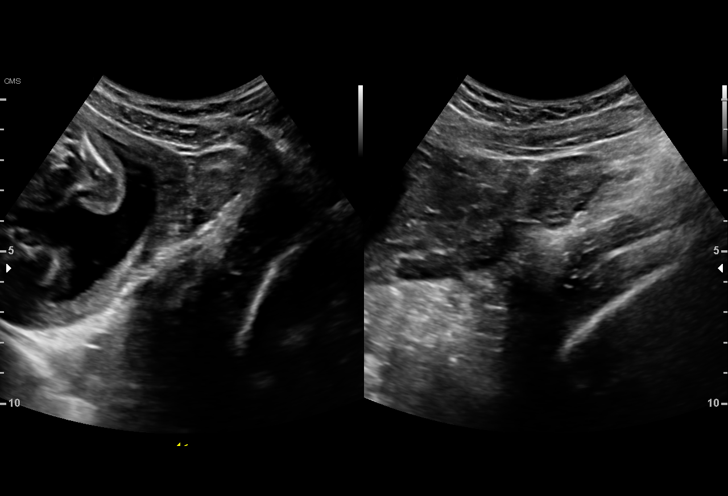
[im 35/86]
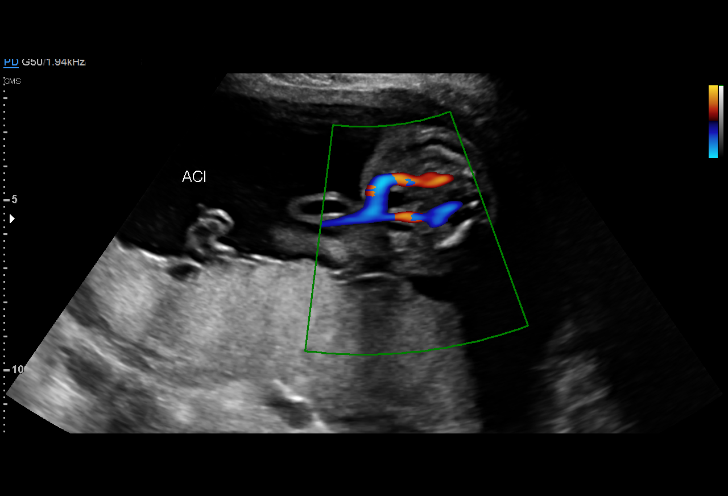
[im 41/86]
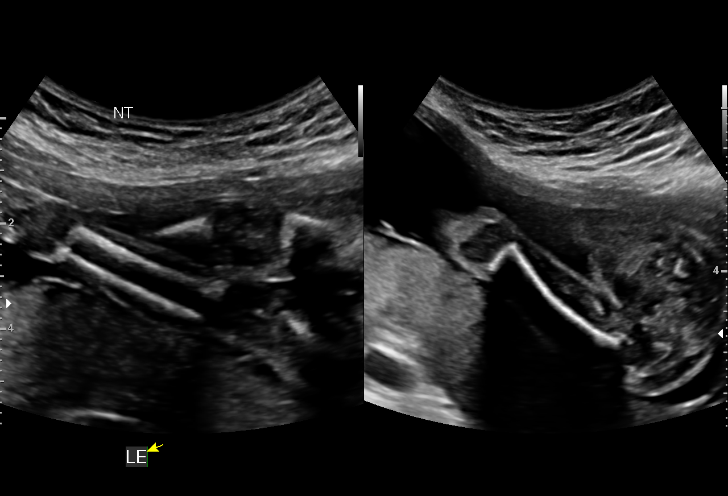
[im 48/86]
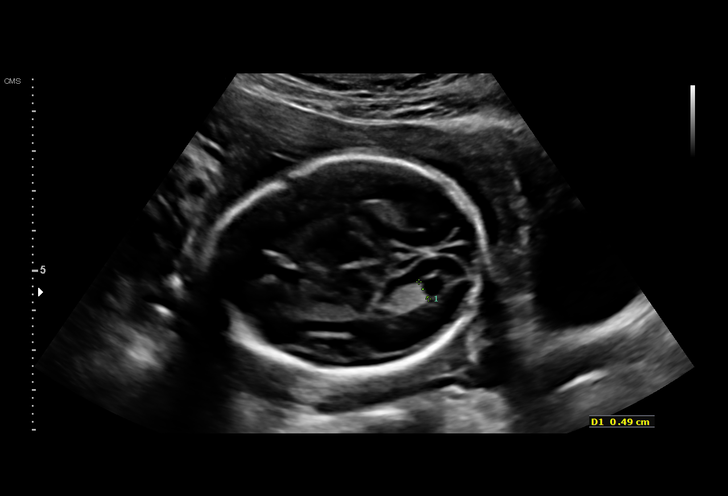
[im 54/86]
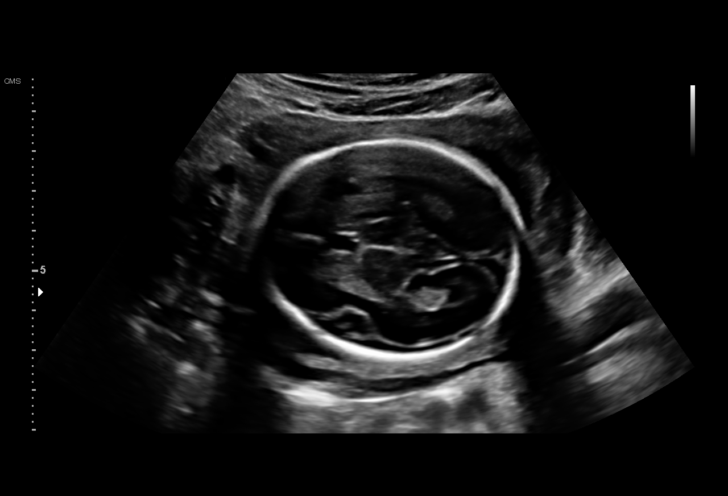
[im 60/86]
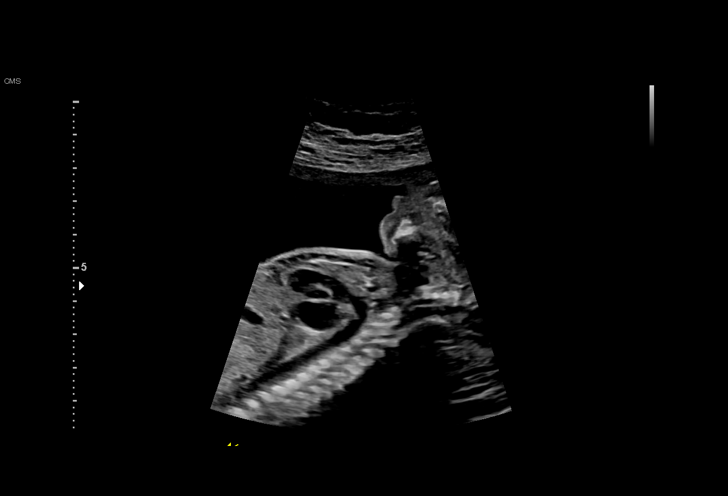
[im 67/86]
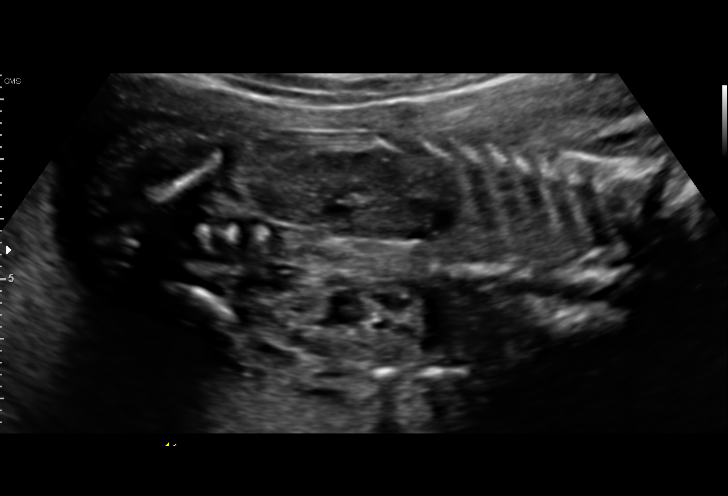
[im 73/86]
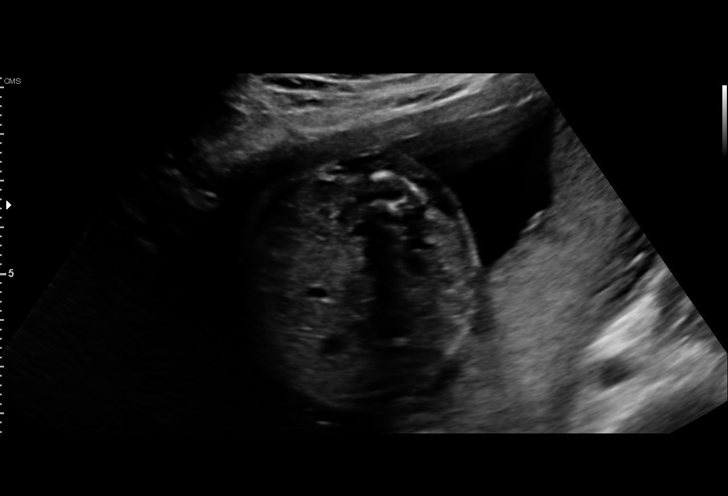
[im 79/86]
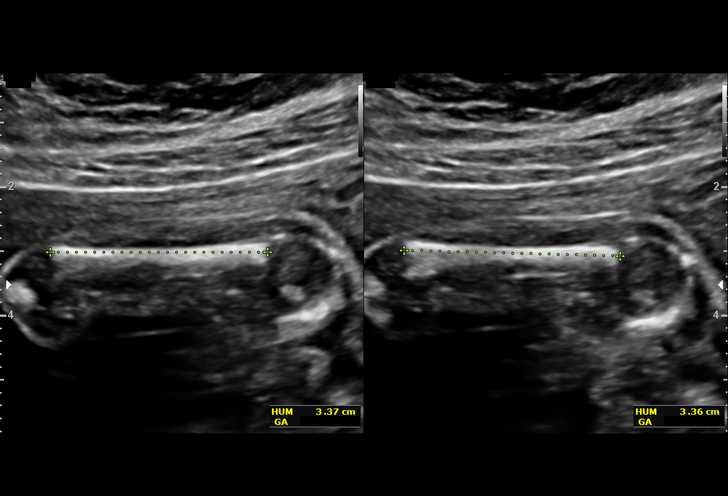
[im 86/86]
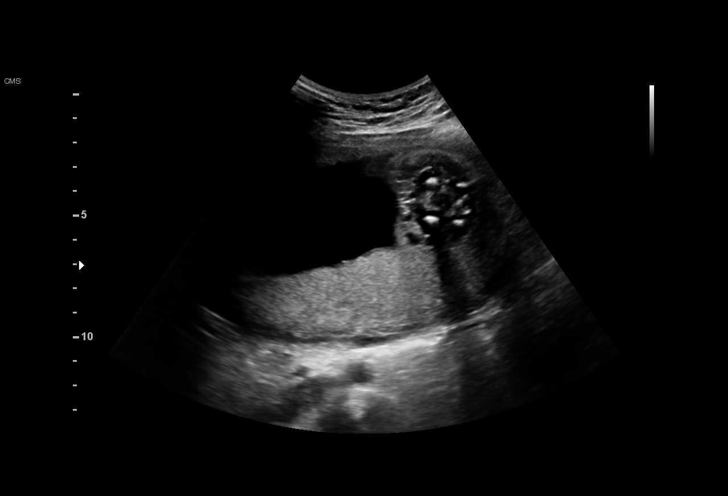

[14 of 28 positions shown; findings below may reference images not displayed]

1  SHSIK           [PHONE_NUMBER]      [PHONE_NUMBER]     [PHONE_NUMBER]
Indications

22 weeks gestation of pregnancy
Basic anatomic survey                          Z36
Uncertain LMP,  Establish Gestational Age      Z36
OB History

Gravidity:    2         Term:   0        Prem:   0        SAB:   0
TOP:          1       Ectopic:  0        Living: 0
Fetal Evaluation

Num Of Fetuses:     1
Fetal Heart         129
Rate(bpm):
Cardiac Activity:   Observed
Presentation:       Cephalic
Placenta:           Posterior, above cervical os
P. Cord Insertion:  Visualized, central

Amniotic Fluid
AFI FV:      Subjectively within normal limits

Largest Pocket(cm)
5.7
Biometry

BPD:      53.5  mm     G. Age:  22w 2d         58  %    CI:        72.72   %   70 - 86
FL/HC:      18.3   %   18.4 -
HC:      199.5  mm     G. Age:  22w 1d         43  %    HC/AC:      1.17       1.06 -
AC:      171.2  mm     G. Age:  22w 1d         45  %    FL/BPD:     68.2   %   71 - 87
FL:       36.5  mm     G. Age:  21w 4d         26  %    FL/AC:      21.3   %   20 - 24
HUM:      33.6  mm     G. Age:  21w 3d         34  %
CER:      23.3  mm     G. Age:  21w 5d         40  %
CM:        6.7  mm

Est. FW:     460  gm          1 lb      43  %
Gestational Age

LMP:           23w 5d       Date:   [DATE]                 EDD:   [DATE]
U/S Today:     22w 0d                                        EDD:   [DATE]
Best:          22w 0d    Det. By:   U/S ([DATE])           EDD:   [DATE]
Anatomy

Cranium:               Appears normal         Aortic Arch:            Appears normal
Cavum:                 Appears normal         Ductal Arch:            Appears normal
Ventricles:            Appears normal         Diaphragm:              Appears normal
Choroid Plexus:        Appears normal         Stomach:                Appears normal, left
sided
Cerebellum:            Appears normal         Abdomen:                Appears normal
Posterior Fossa:       Appears normal         Abdominal Wall:         Appears nml (cord
insert, abd wall)
Nuchal Fold:           Not applicable (>20    Cord Vessels:           Appears normal (3
wks GA)                                        vessel cord)
Face:                  Appears normal         Kidneys:                Appear normal
(orbits and profile)
Lips:                  Appears normal         Bladder:                Appears normal
Thoracic:              Appears normal         Spine:                  Appears normal
Heart:                 Appears normal         Upper Extremities:      Appears normal
(4CH, axis, and
situs)
RVOT:                  Appears normal         Lower Extremities:      Appears normal
LVOT:                  Appears normal

Other:  Parents do not wish to know sex of fetus today. Heels and 5th digits
visualized. Open hands visualized. Nasal bone visualized.
Cervix Uterus Adnexa

Cervix
Length:              3  cm.
Normal appearance by transabdominal scan.

Uterus
No abnormality visualized.

Left Ovary
Within normal limits.

Right Ovary
Within normal limits.

Cul De Sac:   No free fluid seen.

Adnexa:       No abnormality visualized.
Impression

Singleton IUP at 23+5 weeks by uncertain dates but 22+0
weeks by US today
Anatomic survey completed today and is normal
Growth normal in the 43rd percentile
Recommendations

Would recommend changing the EDC to [DATE]
Follow-up ultrasounds as clinically indicated.

## 2015-10-11 ENCOUNTER — Ambulatory Visit (INDEPENDENT_AMBULATORY_CARE_PROVIDER_SITE_OTHER): Payer: 59 | Admitting: Obstetrics and Gynecology

## 2015-10-11 VITALS — BP 113/76 | HR 92 | Temp 99.0°F | Wt 136.0 lb

## 2015-10-11 DIAGNOSIS — Z3482 Encounter for supervision of other normal pregnancy, second trimester: Secondary | ICD-10-CM

## 2015-10-11 DIAGNOSIS — O0930 Supervision of pregnancy with insufficient antenatal care, unspecified trimester: Secondary | ICD-10-CM | POA: Diagnosis not present

## 2015-10-11 NOTE — Progress Notes (Signed)
   PRENATAL VISIT NOTE  Subjective:  Anna Bean is a 25 y.o. G2P0010 at 2669w5d being seen today for ongoing prenatal care.  She is currently monitored for the following issues for this low-risk pregnancy and has Supervision of normal pregnancy, antepartum and Insufficient antepartum care on her problem list.  Patient reports no complaints.  Contractions: Not present. Vag. Bleeding: None.  Movement: Present. Denies leaking of fluid.   The following portions of the patient's history were reviewed and updated as appropriate: allergies, current medications, past family history, past medical history, past social history, past surgical history and problem list. Problem list updated.  Objective:   Vitals:   10/11/15 1407  BP: 113/76  Pulse: 92  Temp: 99 F (37.2 C)  Weight: 136 lb (61.7 kg)    Fetal Status: Fetal Heart Rate (bpm): 142 Fundal Height: 23 cm Movement: Present     General:  Alert, oriented and cooperative. Patient is in no acute distress.  Skin: Skin is warm and dry. No rash noted.   Cardiovascular: Normal heart rate noted  Respiratory: Normal respiratory effort, no problems with respiration noted  Abdomen: Soft, gravid, appropriate for gestational age. Pain/Pressure: Absent     Pelvic:  Cervical exam deferred        Extremities: Normal range of motion.  Edema: None  Mental Status: Normal mood and affect. Normal behavior. Normal judgment and thought content.   Urinalysis:      Assessment and Plan:  Pregnancy: G2P0010 at 8269w5d  1. Supervision of normal pregnancy, antepartum, second trimester Patient is doing well without complaints Records of 2016 pap smear requested Patient declined flu vaccine Third trimester labs next visit Ultrasound results reviewed with the patient  2. Insufficient antepartum care   Preterm labor symptoms and general obstetric precautions including but not limited to vaginal bleeding, contractions, leaking of fluid and fetal movement were  reviewed in detail with the patient. Please refer to After Visit Summary for other counseling recommendations.  Return in about 4 weeks (around 11/08/2015) for ROB with 2 hr glucola.  Catalina AntiguaPeggy Carmin Dibartolo, MD

## 2015-10-11 NOTE — Progress Notes (Signed)
Patient states that she is overall feeling good.

## 2015-10-12 DIAGNOSIS — Z029 Encounter for administrative examinations, unspecified: Secondary | ICD-10-CM

## 2015-11-08 ENCOUNTER — Ambulatory Visit (INDEPENDENT_AMBULATORY_CARE_PROVIDER_SITE_OTHER): Payer: 59 | Admitting: Obstetrics and Gynecology

## 2015-11-08 ENCOUNTER — Other Ambulatory Visit: Payer: 59

## 2015-11-08 DIAGNOSIS — Z3400 Encounter for supervision of normal first pregnancy, unspecified trimester: Secondary | ICD-10-CM

## 2015-11-08 DIAGNOSIS — Z3482 Encounter for supervision of other normal pregnancy, second trimester: Secondary | ICD-10-CM

## 2015-11-08 NOTE — Progress Notes (Signed)
Subjective:  Anna PippinLaquisha Bean is a 25 y.o. G2P0010 at 4350w5d being seen today for ongoing prenatal care.  She is currently monitored for the following issues for this low-risk pregnancy and has Supervision of normal pregnancy, antepartum and Insufficient antepartum care on her problem list.  Patient reports no complaints.  Contractions: Not present. Vag. Bleeding: None.  Movement: Present. Denies leaking of fluid.   The following portions of the patient's history were reviewed and updated as appropriate: allergies, current medications, past family history, past medical history, past social history, past surgical history and problem list. Problem list updated.  Objective:   Vitals:   11/08/15 0925  BP: 119/76  Pulse: 93  Temp: 97.7 F (36.5 C)  Weight: 148 lb 6.4 oz (67.3 kg)    Fetal Status: Fetal Heart Rate (bpm): 136   Movement: Present     General:  Alert, oriented and cooperative. Patient is in no acute distress.  Skin: Skin is warm and dry. No rash noted.   Cardiovascular: Normal heart rate noted  Respiratory: Normal respiratory effort, no problems with respiration noted  Abdomen: Soft, gravid, appropriate for gestational age. Pain/Pressure: Absent     Pelvic:  Cervical exam deferred        Extremities: Normal range of motion.  Edema: None  Mental Status: Normal mood and affect. Normal behavior. Normal judgment and thought content.   Urinalysis:      Assessment and Plan:  Pregnancy: G2P0010 at 3350w5d  1. Supervision of normal first pregnancy, antepartum Pap smear of 2016 negative 28 wk labs today  Preterm labor symptoms and general obstetric precautions including but not limited to vaginal bleeding, contractions, leaking of fluid and fetal movement were reviewed in detail with the patient. Please refer to After Visit Summary for other counseling recommendations.  No Follow-up on file.   Hermina StaggersMichael L Jerelene Salaam, MD

## 2015-11-08 NOTE — Patient Instructions (Signed)

## 2015-11-08 NOTE — Progress Notes (Signed)
Patient is in the office for routine ob visit, reports feeling well and good fetal movement.

## 2015-11-08 NOTE — Addendum Note (Signed)
Addended by: Natale MilchSTALLING, Muskaan Smet D on: 11/08/2015 10:26 AM   Modules accepted: Orders

## 2015-11-09 LAB — CBC
HEMOGLOBIN: 11.2 g/dL (ref 11.1–15.9)
Hematocrit: 33 % — ABNORMAL LOW (ref 34.0–46.6)
MCH: 29.7 pg (ref 26.6–33.0)
MCHC: 33.9 g/dL (ref 31.5–35.7)
MCV: 88 fL (ref 79–97)
Platelets: 255 10*3/uL (ref 150–379)
RBC: 3.77 x10E6/uL (ref 3.77–5.28)
RDW: 14.8 % (ref 12.3–15.4)
WBC: 9.7 10*3/uL (ref 3.4–10.8)

## 2015-11-09 LAB — GLUCOSE TOLERANCE, 2 HOURS W/ 1HR
Glucose, 1 hour: 139 mg/dL (ref 65–179)
Glucose, 2 hour: 92 mg/dL (ref 65–152)
Glucose, Fasting: 79 mg/dL (ref 65–91)

## 2015-11-09 LAB — HIV ANTIBODY (ROUTINE TESTING W REFLEX): HIV Screen 4th Generation wRfx: NONREACTIVE

## 2015-11-09 LAB — RPR: RPR Ser Ql: NONREACTIVE

## 2015-12-06 ENCOUNTER — Ambulatory Visit (INDEPENDENT_AMBULATORY_CARE_PROVIDER_SITE_OTHER): Payer: 59 | Admitting: Obstetrics & Gynecology

## 2015-12-06 VITALS — BP 116/78 | HR 99 | Temp 98.4°F | Wt 155.2 lb

## 2015-12-06 DIAGNOSIS — Z23 Encounter for immunization: Secondary | ICD-10-CM | POA: Diagnosis not present

## 2015-12-06 DIAGNOSIS — Z3480 Encounter for supervision of other normal pregnancy, unspecified trimester: Secondary | ICD-10-CM

## 2015-12-06 DIAGNOSIS — Z3483 Encounter for supervision of other normal pregnancy, third trimester: Secondary | ICD-10-CM

## 2015-12-06 NOTE — Progress Notes (Signed)
   PRENATAL VISIT NOTE  Subjective:  Anna Bean is a 25 y.o. G2P0010 at 151w5d being seen today for ongoing prenatal care.  She is currently monitored for the following issues for this low-risk pregnancy and has Supervision of normal pregnancy, antepartum and Insufficient antepartum care on her problem list.  Patient reports toothache, wants referral to dentist.  Contractions: Not present. Vag. Bleeding: None.  Movement: Present. Denies leaking of fluid.   The following portions of the patient's history were reviewed and updated as appropriate: allergies, current medications, past family history, past medical history, past social history, past surgical history and problem list. Problem list updated.  Objective:   Vitals:   12/06/15 1004  BP: 116/78  Pulse: 99  Temp: 98.4 F (36.9 C)  Weight: 155 lb 3.2 oz (70.4 kg)    Fetal Status: Fetal Heart Rate (bpm): 130   Movement: Present     General:  Alert, oriented and cooperative. Patient is in no acute distress.  Skin: Skin is warm and dry. No rash noted.   Cardiovascular: Normal heart rate noted  Respiratory: Normal respiratory effort, no problems with respiration noted  Abdomen: Soft, gravid, appropriate for gestational age. Pain/Pressure: Absent     Pelvic:  Cervical exam deferred        Extremities: Normal range of motion.  Edema: None  Mental Status: Normal mood and affect. Normal behavior. Normal judgment and thought content.   Assessment and Plan:  Pregnancy: G2P0010 at 3251w5d  1. Supervision of other normal pregnancy, antepartum   Preterm labor symptoms and general obstetric precautions including but not limited to vaginal bleeding, contractions, leaking of fluid and fetal movement were reviewed in detail with the patient. Please refer to After Visit Summary for other counseling recommendations.  Return in about 2 weeks (around 12/20/2015).   Adam PhenixJames G Clemons Salvucci, MD

## 2015-12-06 NOTE — Progress Notes (Signed)
Patient states that she feels good, reports good fetal movement. 

## 2015-12-25 ENCOUNTER — Ambulatory Visit (INDEPENDENT_AMBULATORY_CARE_PROVIDER_SITE_OTHER): Payer: 59 | Admitting: Obstetrics and Gynecology

## 2015-12-25 VITALS — BP 115/77 | HR 89 | Wt 157.0 lb

## 2015-12-25 DIAGNOSIS — Z3480 Encounter for supervision of other normal pregnancy, unspecified trimester: Secondary | ICD-10-CM

## 2015-12-25 DIAGNOSIS — O0930 Supervision of pregnancy with insufficient antenatal care, unspecified trimester: Secondary | ICD-10-CM

## 2015-12-25 DIAGNOSIS — O0933 Supervision of pregnancy with insufficient antenatal care, third trimester: Secondary | ICD-10-CM

## 2015-12-25 NOTE — Progress Notes (Signed)
   PRENATAL VISIT NOTE  Subjective:  Anna Bean is a 25 y.o. G2P0010 at 1162w3d being seen today for ongoing prenatal care.  She is currently monitored for the following issues for this low-risk pregnancy and has Supervision of normal pregnancy, antepartum and Insufficient antepartum care on her problem list.  Patient reports no complaints.  Contractions: Not present. Vag. Bleeding: None.  Movement: Present. Denies leaking of fluid.   The following portions of the patient's history were reviewed and updated as appropriate: allergies, current medications, past family history, past medical history, past social history, past surgical history and problem list. Problem list updated.  Objective:   Vitals:   12/25/15 1444  BP: 115/77  Pulse: 89  Weight: 157 lb (71.2 kg)    Fetal Status: Fetal Heart Rate (bpm): 145 Fundal Height: 33 cm Movement: Present     General:  Alert, oriented and cooperative. Patient is in no acute distress.  Skin: Skin is warm and dry. No rash noted.   Cardiovascular: Normal heart rate noted  Respiratory: Normal respiratory effort, no problems with respiration noted  Abdomen: Soft, gravid, appropriate for gestational age. Pain/Pressure: Present     Pelvic:  Cervical exam deferred        Extremities: Normal range of motion.  Edema: None  Mental Status: Normal mood and affect. Normal behavior. Normal judgment and thought content.   Assessment and Plan:  Pregnancy: G2P0010 at 7162w3d  1. Supervision of other normal pregnancy, antepartum Patient is doing well without complaints She is still looking for a pediatrician  2. Insufficient antepartum care   Preterm labor symptoms and general obstetric precautions including but not limited to vaginal bleeding, contractions, leaking of fluid and fetal movement were reviewed in detail with the patient. Please refer to After Visit Summary for other counseling recommendations.  Return in about 2 weeks (around  01/08/2016).   Catalina AntiguaPeggy Ranessa Kosta, MD

## 2016-01-11 ENCOUNTER — Ambulatory Visit (INDEPENDENT_AMBULATORY_CARE_PROVIDER_SITE_OTHER): Payer: 59 | Admitting: Obstetrics & Gynecology

## 2016-01-11 ENCOUNTER — Other Ambulatory Visit (HOSPITAL_COMMUNITY)
Admission: RE | Admit: 2016-01-11 | Discharge: 2016-01-11 | Disposition: A | Discharge status: Home / Self Care | Payer: 59 | Source: Ambulatory Visit | Attending: Obstetrics & Gynecology | Admitting: Obstetrics & Gynecology

## 2016-01-11 DIAGNOSIS — Z113 Encounter for screening for infections with a predominantly sexual mode of transmission: Secondary | ICD-10-CM

## 2016-01-11 DIAGNOSIS — Z3480 Encounter for supervision of other normal pregnancy, unspecified trimester: Secondary | ICD-10-CM

## 2016-01-11 DIAGNOSIS — Z3483 Encounter for supervision of other normal pregnancy, third trimester: Secondary | ICD-10-CM

## 2016-01-11 NOTE — Progress Notes (Signed)
   PRENATAL VISIT NOTE  Subjective:  Anna Bean is a 25 y.o. G2P0010 at 7574w6d being seen today for ongoing prenatal care.  She is currently monitored for the following issues for this low-risk pregnancy and has Supervision of normal pregnancy, antepartum and Insufficient antepartum care on her problem list.  Patient reports no complaints.  Contractions: Irritability. Vag. Bleeding: None.  Movement: Present. Denies leaking of fluid.   The following portions of the patient's history were reviewed and updated as appropriate: allergies, current medications, past family history, past medical history, past social history, past surgical history and problem list. Problem list updated.  Objective:   Vitals:   01/11/16 1433  BP: 123/86  Pulse: (!) 103  Weight: 161 lb (73 kg)    Fetal Status: Fetal Heart Rate (bpm): 150   Movement: Present     General:  Alert, oriented and cooperative. Patient is in no acute distress.  Skin: Skin is warm and dry. No rash noted.   Cardiovascular: Normal heart rate noted  Respiratory: Normal respiratory effort, no problems with respiration noted  Abdomen: Soft, gravid, appropriate for gestational age. Pain/Pressure: Absent     Pelvic:  Cervical exam performed        Extremities: Normal range of motion.  Edema: None  Mental Status: Normal mood and affect. Normal behavior. Normal judgment and thought content.   Assessment and Plan:  Pregnancy: G2P0010 at 1474w6d  1. Supervision of other normal pregnancy, antepartum  - Strep Gp B NAA - GC/Chlamydia probe amp ([place])[facility]  Preterm labor symptoms and general obstetric precautions including but not limited to vaginal bleeding, contractions, leaking of fluid and fetal movement were reviewed in detail with the patient. Please refer to After Visit Summary for other counseling recommendations.  Return in about 1 week (around 01/18/2016).   Adam PhenixJames G Arnold, MD

## 2016-01-12 LAB — GC/CHLAMYDIA PROBE AMP (~~LOC~~) NOT AT ARMC
CHLAMYDIA, DNA PROBE: POSITIVE — AB
Neisseria Gonorrhea: NEGATIVE

## 2016-01-13 LAB — STREP GP B NAA: Strep Gp B NAA: POSITIVE — AB

## 2016-01-15 NOTE — L&D Delivery Note (Addendum)
26 y.o. G2P0010 at 414w5d delivered by Dr. Anders Simmondshristina Gambino, a viable female infant in cephalic, ROA position. No nuchal cord. Left anterior shoulder delivered with ease. 60 sec delayed cord clamping. Cord clamped x2 and cut. Placenta delivered spontaneously intact, with 3VC. Fundus firm on exam with massage and pitocin. Good hemostasis noted.  Laceration: None Suture: N/A Good hemostasis noted. EBL: 100 cc  Mom and baby recovering in LDR.    Apgars: 8/9  Weight: pending, skin to skin  I was gloved and present for the delivery in its entirety with the resident, Dr. Anders Simmondshristina Gambino.     Jen MowElizabeth Aaditya Letizia, DO [facility] Fellow [facility] 01/24/2016, 3:41 AM

## 2016-01-17 ENCOUNTER — Other Ambulatory Visit: Payer: Self-pay | Admitting: *Deleted

## 2016-01-17 DIAGNOSIS — A749 Chlamydial infection, unspecified: Secondary | ICD-10-CM

## 2016-01-17 DIAGNOSIS — O98813 Other maternal infectious and parasitic diseases complicating pregnancy, third trimester: Principal | ICD-10-CM

## 2016-01-17 MED ORDER — AZITHROMYCIN 250 MG PO TABS: 1000.0000 mg | ORAL_TABLET | Freq: Once | ORAL | 0 refills | Status: AC

## 2016-01-17 NOTE — Progress Notes (Signed)
Pt has been made aware of recent lab result, +GBS and +CH. Pt was advised to STD recommendations and Azithromycin 1000mg  was sent to pharmacy per protocol. Pt made aware that she may have repeat testing before delivery. Health dept form to be sent.

## 2016-01-19 ENCOUNTER — Encounter: Payer: Self-pay | Admitting: Obstetrics

## 2016-01-19 ENCOUNTER — Ambulatory Visit (INDEPENDENT_AMBULATORY_CARE_PROVIDER_SITE_OTHER): Payer: 59 | Admitting: Obstetrics

## 2016-01-19 DIAGNOSIS — Z3480 Encounter for supervision of other normal pregnancy, unspecified trimester: Secondary | ICD-10-CM

## 2016-01-19 NOTE — Progress Notes (Signed)
Subjective:    Anna PippinLaquisha Bean is a 26 y.o. female being seen today for her obstetrical visit. She is at 670w0d gestation. Patient reports no complaints. Fetal movement: normal.  Problem List Items Addressed This Visit    Supervision of normal pregnancy, antepartum     Patient Active Problem List   Diagnosis Date Noted  . Supervision of normal pregnancy, antepartum 09/13/2015  . Insufficient antepartum care 09/13/2015    Objective:    BP 131/83   Pulse 97   Temp 98.5 F (36.9 C)   Wt 165 lb 4.8 oz (75 kg)   LMP 04/23/2015 (Exact Date)   BMI 28.60 kg/m  FHT: 150 BPM  Uterine Size: size equals dates    Assessment:    Pregnancy @ 4770w0d weeks   Plan:   Plans for delivery: Vaginal anticipated; labs reviewed; problem list updated Counseling: Consent signed. Infant feeding: plans to breastfeed. Cigarette smoking: never smoked. L&D discussion: symptoms of labor, discussed when to call, discussed what number to call, anesthetic/analgesic options reviewed and delivering clinician:  plans no preference. Postpartum supports and preparation: circumcision discussed and contraception plans discussed.  Follow up in 1 Week.  Patient ID: Anna Bean, female   DOB: 1990-05-30, 26 y.o.   MRN: [id number]

## 2016-01-19 NOTE — Progress Notes (Signed)
Patient states that she feels good today, reports good fetal movement. 

## 2016-01-22 ENCOUNTER — Inpatient Hospital Stay (HOSPITAL_COMMUNITY)
Admission: AD | Admit: 2016-01-22 | Discharge: 2016-01-22 | Disposition: A | Discharge status: Home / Self Care | Payer: No Typology Code available for payment source | Source: Ambulatory Visit | Attending: Family Medicine | Admitting: Family Medicine

## 2016-01-22 DIAGNOSIS — Z3490 Encounter for supervision of normal pregnancy, unspecified, unspecified trimester: Secondary | ICD-10-CM | POA: Insufficient documentation

## 2016-01-22 DIAGNOSIS — Z3A00 Weeks of gestation of pregnancy not specified: Secondary | ICD-10-CM | POA: Insufficient documentation

## 2016-01-22 DIAGNOSIS — Z3480 Encounter for supervision of other normal pregnancy, unspecified trimester: Secondary | ICD-10-CM

## 2016-01-22 DIAGNOSIS — O0930 Supervision of pregnancy with insufficient antenatal care, unspecified trimester: Secondary | ICD-10-CM

## 2016-01-22 NOTE — Progress Notes (Signed)
Notified Resident of current condition, resident will see patient.

## 2016-01-22 NOTE — Progress Notes (Signed)
Patient is contracting and not there has been no cervical change.  Resident, Jesusita Okaan, notified.  Plan is to let patient eat her meal (just brought) and have her walk and re-check and call resident back.

## 2016-01-22 NOTE — MAU Note (Signed)
Pt states her mucus plug came out yesterday morning, started having uc's yesterday, still continue today.  Also pelvic pressure.  Denies bleeding or LOF.

## 2016-01-22 NOTE — Discharge Instructions (Signed)
Braxton Hicks Contractions °Contractions of the uterus can occur throughout pregnancy. Contractions are not always a sign that you are in labor.  °WHAT ARE BRAXTON HICKS CONTRACTIONS?  °Contractions that occur before labor are called Braxton Hicks contractions, or false labor. Toward the end of pregnancy (32-34 weeks), these contractions can develop more often and may become more forceful. This is not true labor because these contractions do not result in opening (dilatation) and thinning of the cervix. They are sometimes difficult to tell apart from true labor because these contractions can be forceful and people have different pain tolerances. You should not feel embarrassed if you go to the hospital with false labor. Sometimes, the only way to tell if you are in true labor is for your health care provider to look for changes in the cervix. °If there are no prenatal problems or other health problems associated with the pregnancy, it is completely safe to be sent home with false labor and await the onset of true labor. °HOW CAN YOU TELL THE DIFFERENCE BETWEEN TRUE AND FALSE LABOR? °False Labor  °· The contractions of false labor are usually shorter and not as hard as those of true labor.   °· The contractions are usually irregular.   °· The contractions are often felt in the front of the lower abdomen and in the groin.   °· The contractions may go away when you walk around or change positions while lying down.   °· The contractions get weaker and are shorter lasting as time goes on.   °· The contractions do not usually become progressively stronger, regular, and closer together as with true labor.   °True Labor  °· Contractions in true labor last 30-70 seconds, become very regular, usually become more intense, and increase in frequency.   °· The contractions do not go away with walking.   °· The discomfort is usually felt in the top of the uterus and spreads to the lower abdomen and low back.   °· True labor can be  determined by your health care provider with an exam. This will show that the cervix is dilating and getting thinner.   °WHAT TO REMEMBER °· Keep up with your usual exercises and follow other instructions given by your health care provider.   °· Take medicines as directed by your health care provider.   °· Keep your regular prenatal appointments.   °· Eat and drink lightly if you think you are going into labor.   °· If Braxton Hicks contractions are making you uncomfortable:   °¨ Change your position from lying down or resting to walking, or from walking to resting.   °¨ Sit and rest in a tub of warm water.   °¨ Drink 2-3 glasses of water. Dehydration may cause these contractions.   °¨ Do slow and deep breathing several times an hour.   °WHEN SHOULD I SEEK IMMEDIATE MEDICAL CARE? °Seek immediate medical care if: °· Your contractions become stronger, more regular, and closer together.   °· You have fluid leaking or gushing from your vagina.   °· You have a fever.   °· You pass blood-tinged mucus.   °· You have vaginal bleeding.   °· You have continuous abdominal pain.   °· You have low back pain that you never had before.   °· You feel your baby's head pushing down and causing pelvic pressure.   °· Your baby is not moving as much as it used to.   °This information is not intended to replace advice given to you by your health care provider. Make sure you discuss any questions you have with your health care   provider. °Document Released: 12/31/2004 Document Revised: 04/24/2015 Document Reviewed: 10/12/2012 °Elsevier Interactive Patient Education © 2017 [employer] Inc[employer] ° °

## 2016-01-23 ENCOUNTER — Encounter (HOSPITAL_COMMUNITY): Payer: Self-pay | Admitting: *Deleted

## 2016-01-23 ENCOUNTER — Inpatient Hospital Stay (HOSPITAL_COMMUNITY)
Admission: AD | Admit: 2016-01-23 | Discharge: 2016-01-25 | DRG: 775 | Disposition: A | Discharge status: Home / Self Care | Payer: No Typology Code available for payment source | Source: Ambulatory Visit | Attending: Obstetrics & Gynecology | Admitting: Obstetrics & Gynecology

## 2016-01-23 DIAGNOSIS — Z8249 Family history of ischemic heart disease and other diseases of the circulatory system: Secondary | ICD-10-CM | POA: Diagnosis not present

## 2016-01-23 DIAGNOSIS — O99824 Streptococcus B carrier state complicating childbirth: Secondary | ICD-10-CM | POA: Diagnosis present

## 2016-01-23 DIAGNOSIS — O0930 Supervision of pregnancy with insufficient antenatal care, unspecified trimester: Secondary | ICD-10-CM

## 2016-01-23 DIAGNOSIS — Z3A37 37 weeks gestation of pregnancy: Secondary | ICD-10-CM | POA: Diagnosis not present

## 2016-01-23 DIAGNOSIS — Z3490 Encounter for supervision of normal pregnancy, unspecified, unspecified trimester: Secondary | ICD-10-CM

## 2016-01-23 DIAGNOSIS — Z3493 Encounter for supervision of normal pregnancy, unspecified, third trimester: Secondary | ICD-10-CM | POA: Diagnosis present

## 2016-01-23 LAB — CBC
HEMATOCRIT: 36.7 % (ref 36.0–46.0)
HEMOGLOBIN: 12.7 g/dL (ref 12.0–15.0)
MCH: 28.9 pg (ref 26.0–34.0)
MCHC: 34.6 g/dL (ref 30.0–36.0)
MCV: 83.4 fL (ref 78.0–100.0)
Platelets: 233 10*3/uL (ref 150–400)
RBC: 4.4 MIL/uL (ref 3.87–5.11)
RDW: 15.4 % (ref 11.5–15.5)
WBC: 6.7 10*3/uL (ref 4.0–10.5)

## 2016-01-23 LAB — ABO/RH: ABO/RH(D): A POS

## 2016-01-23 LAB — TYPE AND SCREEN
ABO/RH(D): A POS
ANTIBODY SCREEN: NEGATIVE

## 2016-01-23 MED ORDER — LACTATED RINGERS IV SOLN
INTRAVENOUS | Status: DC
  Administered 2016-01-23: 20:00:00 via INTRAVENOUS

## 2016-01-23 MED ORDER — FENTANYL CITRATE (PF) 100 MCG/2ML IJ SOLN
100.0000 ug | INTRAMUSCULAR | Status: DC | PRN
  Administered 2016-01-23 (×2): 100 ug via INTRAVENOUS
  Filled 2016-01-23 (×2): qty 2

## 2016-01-23 MED ORDER — PHENYLEPHRINE 40 MCG/ML (10ML) SYRINGE FOR IV PUSH (FOR BLOOD PRESSURE SUPPORT)
80.0000 ug | PREFILLED_SYRINGE | INTRAVENOUS | Status: DC | PRN
  Filled 2016-01-23: qty 5

## 2016-01-23 MED ORDER — OXYTOCIN 40 UNITS IN LACTATED RINGERS INFUSION - SIMPLE MED
1.0000 m[IU]/min | INTRAVENOUS | Status: DC
  Administered 2016-01-23: 2 m[IU]/min via INTRAVENOUS

## 2016-01-23 MED ORDER — PHENYLEPHRINE 40 MCG/ML (10ML) SYRINGE FOR IV PUSH (FOR BLOOD PRESSURE SUPPORT)
PREFILLED_SYRINGE | INTRAVENOUS | Status: AC
  Filled 2016-01-23: qty 10

## 2016-01-23 MED ORDER — TERBUTALINE SULFATE 1 MG/ML IJ SOLN
0.2500 mg | Freq: Once | INTRAMUSCULAR | Status: DC | PRN
  Filled 2016-01-23: qty 1

## 2016-01-23 MED ORDER — DIPHENHYDRAMINE HCL 50 MG/ML IJ SOLN: 12.5000 mg | INTRAMUSCULAR | Status: DC | PRN

## 2016-01-23 MED ORDER — PENICILLIN G POTASSIUM 5000000 UNITS IJ SOLR
5.0000 10*6.[IU] | Freq: Once | INTRAVENOUS | Status: AC
  Administered 2016-01-23: 5 10*6.[IU] via INTRAVENOUS
  Filled 2016-01-23: qty 5

## 2016-01-23 MED ORDER — ACETAMINOPHEN 325 MG PO TABS: 650.0000 mg | ORAL_TABLET | ORAL | Status: DC | PRN

## 2016-01-23 MED ORDER — ONDANSETRON HCL 4 MG/2ML IJ SOLN: 4.0000 mg | Freq: Four times a day (QID) | INTRAMUSCULAR | Status: DC | PRN

## 2016-01-23 MED ORDER — FENTANYL 2.5 MCG/ML BUPIVACAINE 1/10 % EPIDURAL INFUSION (WH - ANES)
14.0000 mL/h | INTRAMUSCULAR | Status: DC | PRN
  Administered 2016-01-24: 14 mL/h via EPIDURAL

## 2016-01-23 MED ORDER — SOD CITRATE-CITRIC ACID 500-334 MG/5ML PO SOLN: 30.0000 mL | ORAL | Status: DC | PRN

## 2016-01-23 MED ORDER — LACTATED RINGERS IV SOLN: 500.0000 mL | INTRAVENOUS | Status: DC | PRN

## 2016-01-23 MED ORDER — LACTATED RINGERS IV SOLN: 500.0000 mL | Freq: Once | INTRAVENOUS | Status: DC

## 2016-01-23 MED ORDER — OXYTOCIN 40 UNITS IN LACTATED RINGERS INFUSION - SIMPLE MED
2.5000 [IU]/h | INTRAVENOUS | Status: DC
  Filled 2016-01-23: qty 1000

## 2016-01-23 MED ORDER — FENTANYL 2.5 MCG/ML BUPIVACAINE 1/10 % EPIDURAL INFUSION (WH - ANES)
INTRAMUSCULAR | Status: AC
  Filled 2016-01-23: qty 100

## 2016-01-23 MED ORDER — PENICILLIN G POT IN DEXTROSE 60000 UNIT/ML IV SOLN
3.0000 10*6.[IU] | INTRAVENOUS | Status: DC
  Administered 2016-01-24: 3 10*6.[IU] via INTRAVENOUS
  Filled 2016-01-23 (×4): qty 50

## 2016-01-23 MED ORDER — EPHEDRINE 5 MG/ML INJ
10.0000 mg | INTRAVENOUS | Status: DC | PRN
  Filled 2016-01-23: qty 4

## 2016-01-23 MED ORDER — LIDOCAINE HCL (PF) 1 % IJ SOLN
30.0000 mL | INTRAMUSCULAR | Status: DC | PRN
  Filled 2016-01-23: qty 30

## 2016-01-23 MED ORDER — OXYTOCIN BOLUS FROM INFUSION
500.0000 mL | Freq: Once | INTRAVENOUS | Status: AC
  Administered 2016-01-24: 500 mL via INTRAVENOUS

## 2016-01-23 NOTE — MAU Note (Signed)
Pt presents to MAU with complaints of contractions that started yesterday. Pt states she was evaluated in MAU yesterday and was told she was 3 cms, Denies any vaginal bleeding or LOF

## 2016-01-23 NOTE — Progress Notes (Signed)
Patient ID: Fae PippinLaquisha Weckwerth, female   DOB: 1990/06/19, 26 y.o.   MRN: [id number]  S: Patient seen & examined for progress of labor. Patient comfortable in the room, contractions are coming/going, not persistant.    O:  Vitals:   01/23/16 1438 01/23/16 2003 01/23/16 2009  BP: 127/90 125/81   Pulse: 95 82   Resp: 18 20   Temp: 98.9 F (37.2 C) 98.3 F (36.8 C)   TempSrc: Oral Oral   Weight:   162 lb (73.5 kg)  Height:   5\' 3"  (1.6 m)    Dilation: 6 Effacement (%): 90, 100 Cervical Position: Posterior Station: -3 Presentation: Vertex Exam by:: Dr. Omer JackMumaw   FHT: 135 bpm, mod var, +accels, no decels TOCO: Irregular   A/P: Pitocin to be started, will augment labor, having cervical change since admission Continue expectant management Anticipate SVD

## 2016-01-23 NOTE — H&P (Signed)
LABOR AND DELIVERY ADMISSION HISTORY AND PHYSICAL NOTE  Anna Bean is a 26 y.o. female G2P0010 with IUP at [redacted]w[redacted]d by 22 wk u/s presenting for painful contractions beginning this afternoon.   She reports positive fetal movement. She denies leakage of fluid or vaginal bleeding.  Prenatal History/Complications:  Past Medical History: Past Medical History:  Diagnosis Date  . Medical history non-contributory     Past Surgical History: Past Surgical History:  Procedure Laterality Date  . ADENOIDECTOMY      Obstetrical History: OB History    Gravida Para Term Preterm AB Living   2       1     SAB TAB Ectopic Multiple Live Births     1            Social History: Social History   Social History  . Marital status: Single    Spouse name: N/A  . Number of children: N/A  . Years of education: N/A   Social History Main Topics  . Smoking status: Never Smoker  . Smokeless tobacco: Never Used  . Alcohol use No  . Drug use:     Types: Marijuana     Comment: last use may 2017  . Sexual activity: Yes    Birth control/ protection: None   Other Topics Concern  . None   Social History Narrative  . None    Family History: Family History  Problem Relation Age of Onset  . Hypertension Mother   . Hypokalemia Mother     Allergies: No Known Allergies  Prescriptions Prior to Admission  Medication Sig Dispense Refill Last Dose  . Prenatal Vit-Fe Fumarate-FA (GNP PRENATAL VITAMINS) 28-0.8 MG TABS Take 1 tablet by mouth daily. 30 tablet 0 Past Week at Unknown time     Review of Systems   All systems reviewed and negative except as stated in HPI  Blood pressure 127/90, pulse 95, temperature 98.9 F (37.2 C), temperature source Oral, resp. rate 18, last menstrual period 04/23/2015. General appearance: alert, cooperative, appears stated age and moderate distress Lungs: clear to auscultation bilaterally Heart: regular rate and rhythm Abdomen: soft, non-tender; bowel  sounds normal Extremities: No calf swelling or tenderness Presentation: cephalic Fetal monitoring: 140/mod/+a/-d Uterine activity: q 3 min Dilation: 5 Effacement (%): 70 Station: -1 Exam by:: Dr. Ashok Pall   Prenatal labs: ABO, Rh: --/--/A POS (01/09 1800) Antibody: PENDING (01/09 1800) Rubella: !Error!imm RPR: Non Reactive (10/25 1104)  HBsAg: Negative (08/30 1612)  HIV: Non Reactive (10/25 1104)  GBS: Positive (12/28 1515)  2 hr Glucola: wnl Genetic screening:  Quad wnl Anatomy US: wnl   Prenatal Transfer Tool  Maternal Diabetes: No Genetic Screening: Normal Maternal Ultrasounds/Referrals: Normal Fetal Ultrasounds or other Referrals:  None Maternal Substance Abuse:  No Significant Maternal Medications:  None Significant Maternal Lab Results: gbs and chlamydia positive  Results for orders placed or performed during the hospital encounter of 01/23/16 (from the past 24 hour(s))  CBC   Collection Time: 01/23/16  6:00 PM  Result Value Ref Range   WBC 6.7 4.0 - 10.5 K/uL   RBC 4.40 3.87 - 5.11 MIL/uL   Hemoglobin 12.7 12.0 - 15.0 g/dL   HCT 16.1 09.6 - 04.5 %   MCV 83.4 78.0 - 100.0 fL   MCH 28.9 26.0 - 34.0 pg   MCHC 34.6 30.0 - 36.0 g/dL   RDW 40.9 81.1 - 91.4 %   Platelets 233 150 - 400 K/uL  Type and screen [facility]  [place]   Collection Time: 01/23/16  6:00 PM  Result Value Ref Range   ABO/RH(D) A POS    Antibody Screen PENDING    Sample Expiration 01/26/2016     Patient Active Problem List   Diagnosis Date Noted  . Supervision of normal pregnancy, antepartum 09/13/2015  . Insufficient antepartum care 09/13/2015    Assessment: Anna Bean is a 26 y.o. G2P0010 at 3123w4d here for sol  #Labor: still in latent labor but very uncomfortable, did not respond to IV fluids in mau, and is making cervical change, from 3 cm to stretching to 5 on check just now. Will admit, treat with iv pain meds, and monitor. Will hold on augmentation/epidural. #Pain: Iv  fentanyl3 #FWB: Cat 1 #ID:  gbs pos, starting pcn #MOF: breast #MOC: OCPs @ 6 wks #Circ:  N/a #chlamydia: dx 12/28, treated 1/3, pt says did receive treated. Peds made aware via transfer tool  Silvano Bilisoah B Wouk 01/23/2016, 7:19 PM

## 2016-01-23 NOTE — Anesthesia Preprocedure Evaluation (Signed)
Anesthesia Evaluation  Patient identified by MRN, date of birth, ID band Patient awake    Reviewed: Allergy & Precautions, Patient's Chart, lab work & pertinent test results  Airway Mallampati: III       Dental no notable dental hx. (+) Teeth Intact   Pulmonary neg pulmonary ROS,    Pulmonary exam normal breath sounds clear to auscultation       Cardiovascular negative cardio ROS Normal cardiovascular exam Rhythm:Regular Rate:Normal     Neuro/Psych negative neurological ROS  negative psych ROS   GI/Hepatic negative GI ROS, Neg liver ROS,   Endo/Other    Renal/GU negative Renal ROS  negative genitourinary   Musculoskeletal negative musculoskeletal ROS (+)   Abdominal   Peds  Hematology negative hematology ROS (+)   Anesthesia Other Findings   Reproductive/Obstetrics (+) Pregnancy                             Anesthesia Physical Anesthesia Plan  ASA: II  Anesthesia Plan: Epidural   Post-op Pain Management:    Induction:   Airway Management Planned: Natural Airway  Additional Equipment:   Intra-op Plan:   Post-operative Plan:   Informed Consent: I have reviewed the patients History and Physical, chart, labs and discussed the procedure including the risks, benefits and alternatives for the proposed anesthesia with the patient or authorized representative who has indicated his/her understanding and acceptance.     Plan Discussed with: Anesthesiologist  Anesthesia Plan Comments:         Anesthesia Quick Evaluation

## 2016-01-24 ENCOUNTER — Inpatient Hospital Stay (HOSPITAL_COMMUNITY): Payer: No Typology Code available for payment source | Admitting: Anesthesiology

## 2016-01-24 ENCOUNTER — Encounter (HOSPITAL_COMMUNITY): Payer: Self-pay

## 2016-01-24 DIAGNOSIS — O99824 Streptococcus B carrier state complicating childbirth: Secondary | ICD-10-CM

## 2016-01-24 DIAGNOSIS — Z3A37 37 weeks gestation of pregnancy: Secondary | ICD-10-CM

## 2016-01-24 LAB — RPR: RPR: NONREACTIVE

## 2016-01-24 MED ORDER — DIBUCAINE 1 % RE OINT: 1.0000 "application " | TOPICAL_OINTMENT | RECTAL | Status: DC | PRN

## 2016-01-24 MED ORDER — LIDOCAINE HCL (PF) 1 % IJ SOLN
INTRAMUSCULAR | Status: DC | PRN
  Administered 2016-01-24 (×2): 4 mL via EPIDURAL

## 2016-01-24 MED ORDER — SENNOSIDES-DOCUSATE SODIUM 8.6-50 MG PO TABS
2.0000 | ORAL_TABLET | ORAL | Status: DC
  Administered 2016-01-25: 2 via ORAL
  Filled 2016-01-24: qty 2

## 2016-01-24 MED ORDER — SODIUM CHLORIDE 0.9% FLUSH: 3.0000 mL | INTRAVENOUS | Status: DC | PRN

## 2016-01-24 MED ORDER — PRENATAL MULTIVITAMIN CH
1.0000 | ORAL_TABLET | Freq: Every day | ORAL | Status: DC
  Administered 2016-01-24 – 2016-01-25 (×2): 1 via ORAL
  Filled 2016-01-24 (×2): qty 1

## 2016-01-24 MED ORDER — TETANUS-DIPHTH-ACELL PERTUSSIS 5-2.5-18.5 LF-MCG/0.5 IM SUSP: 0.5000 mL | Freq: Once | INTRAMUSCULAR | Status: DC

## 2016-01-24 MED ORDER — IBUPROFEN 100 MG/5ML PO SUSP
600.0000 mg | Freq: Four times a day (QID) | ORAL | Status: DC
  Administered 2016-01-25 (×3): 600 mg via ORAL
  Filled 2016-01-24 (×7): qty 30

## 2016-01-24 MED ORDER — VITAMIN K1 1 MG/0.5ML IJ SOLN
INTRAMUSCULAR | Status: AC
  Filled 2016-01-24: qty 0.5

## 2016-01-24 MED ORDER — WITCH HAZEL-GLYCERIN EX PADS: 1.0000 "application " | MEDICATED_PAD | CUTANEOUS | Status: DC | PRN

## 2016-01-24 MED ORDER — OXYTOCIN 40 UNITS IN LACTATED RINGERS INFUSION - SIMPLE MED: 2.5000 [IU]/h | INTRAVENOUS | Status: DC | PRN

## 2016-01-24 MED ORDER — SODIUM CHLORIDE 0.9 % IV SOLN: 250.0000 mL | INTRAVENOUS | Status: DC | PRN

## 2016-01-24 MED ORDER — BENZOCAINE-MENTHOL 20-0.5 % EX AERO
1.0000 "application " | INHALATION_SPRAY | CUTANEOUS | Status: DC | PRN
  Filled 2016-01-24: qty 56

## 2016-01-24 MED ORDER — SIMETHICONE 80 MG PO CHEW
80.0000 mg | CHEWABLE_TABLET | ORAL | Status: DC | PRN
  Administered 2016-01-25: 80 mg via ORAL

## 2016-01-24 MED ORDER — ONDANSETRON HCL 4 MG PO TABS: 4.0000 mg | ORAL_TABLET | ORAL | Status: DC | PRN

## 2016-01-24 MED ORDER — ZOLPIDEM TARTRATE 5 MG PO TABS: 5.0000 mg | ORAL_TABLET | Freq: Every evening | ORAL | Status: DC | PRN

## 2016-01-24 MED ORDER — IBUPROFEN 600 MG PO TABS
600.0000 mg | ORAL_TABLET | Freq: Four times a day (QID) | ORAL | Status: DC
  Administered 2016-01-24 (×3): 600 mg via ORAL
  Filled 2016-01-24 (×3): qty 1

## 2016-01-24 MED ORDER — SODIUM CHLORIDE 0.9% FLUSH: 3.0000 mL | Freq: Two times a day (BID) | INTRAVENOUS | Status: DC

## 2016-01-24 MED ORDER — ONDANSETRON HCL 4 MG/2ML IJ SOLN: 4.0000 mg | INTRAMUSCULAR | Status: DC | PRN

## 2016-01-24 MED ORDER — DIPHENHYDRAMINE HCL 25 MG PO CAPS: 25.0000 mg | ORAL_CAPSULE | Freq: Four times a day (QID) | ORAL | Status: DC | PRN

## 2016-01-24 MED ORDER — COCONUT OIL OIL: 1.0000 "application " | TOPICAL_OIL | Status: DC | PRN

## 2016-01-24 MED ORDER — ACETAMINOPHEN 325 MG PO TABS: 650.0000 mg | ORAL_TABLET | ORAL | Status: DC | PRN

## 2016-01-24 NOTE — Progress Notes (Signed)
Anna PippinLaquisha Bean is a 26 y.o. G2P0010 at 8048w5d admitted for active labor  Subjective: Patient has no complaints at this time. She is sitting up in bed eating a popsicle and watching TV.   Objective: BP 106/70   Pulse 82   Temp 97.5 F (36.4 C) (Oral)   Resp 18   Ht 5\' 3"  (1.6 m)   Wt 73.5 kg (162 lb)   LMP 04/23/2015 (Exact Date)   BMI 28.70 kg/m  No intake/output data recorded. No intake/output data recorded.  FHT:  FHR: 125 bpm, variability: minimal ,  accelerations:  Present,  decelerations:  Present 1 variable in last hour but otherwise no decels UC:   regular, every 2-4 minutes SVE:   Dilation: 8.5 Effacement (%): 90, 100 Station: -2 Exam by:: Mervin Kung Stewart, RN  Labs: Lab Results  Component Value Date   WBC 6.7 01/23/2016   HGB 12.7 01/23/2016   HCT 36.7 01/23/2016   MCV 83.4 01/23/2016   PLT 233 01/23/2016    Assessment / Plan: Spontaneous labor, progressing normally  Labor: Progressing on Pitocin, making good cervical change Fetal Wellbeing:  Category I Pain Control:  Epidural I/D:  GBS pos- PCN. Chlamydia- already treated on 1/3 Anticipated MOD:  NSVD  Anna DinningChristina M Bean 01/24/2016, 1:11 AM

## 2016-01-24 NOTE — Anesthesia Postprocedure Evaluation (Signed)
Anesthesia Post Note  Patient: Anna Bean  Procedure(s) Performed: * No procedures listed *  Anesthesia Type: Epidural Level of consciousness: awake Pain management: pain level controlled Vital Signs Assessment: post-procedure vital signs reviewed and stable Respiratory status: spontaneous breathing Cardiovascular status: stable Postop Assessment: no headache, no backache, epidural receding and patient able to bend at knees Anesthetic complications: no        Last Vitals:  Vitals:   01/24/16 0611 01/24/16 1010  BP: 123/86 120/73  Pulse: 81 73  Resp: 18 20  Temp: 36.1 C 36.4 C    Last Pain:  Vitals:   01/24/16 1010  TempSrc: Oral  PainSc: 0-No pain   Pain Goal:                 Edison PaceWILKERSON,[name]

## 2016-01-24 NOTE — Anesthesia Procedure Notes (Signed)
Epidural Patient location during procedure: OB Start time: 01/24/2016 12:08 AM  Staffing Anesthesiologist: Mal AmabileFOSTER, Shai Mckenzie Performed: anesthesiologist   Preanesthetic Checklist Completed: patient identified, site marked, surgical consent, pre-op evaluation, timeout performed, IV checked, risks and benefits discussed and monitors and equipment checked  Epidural Patient position: sitting Prep: site prepped and draped and DuraPrep Patient monitoring: continuous pulse ox and blood pressure Approach: midline Location: L3-L4 Injection technique: LOR air  Needle:  Needle type: Tuohy  Needle gauge: 17 G Needle length: 9 cm and 9 Needle insertion depth: 7 cm Catheter type: closed end flexible Catheter size: 19 Gauge Catheter at skin depth: 12 cm Test dose: negative and Other  Assessment Events: blood not aspirated, injection not painful, no injection resistance, negative IV test and no paresthesia  Additional Notes Patient identified. Risks and benefits discussed including failed block, incomplete  Pain control, post dural puncture headache, nerve damage, paralysis, blood pressure Changes, nausea, vomiting, reactions to medications-both toxic and allergic and post Partum back pain. All questions were answered. Patient expressed understanding and wished to proceed. Sterile technique was used throughout procedure. Epidural site was Dressed with sterile barrier dressing. No paresthesias, signs of intravascular injection Or signs of intrathecal spread were encountered.  Patient was more comfortable after the epidural was dosed. Please see RN's note for documentation of vital signs and FHR which are stable.

## 2016-01-24 NOTE — Lactation Note (Signed)
This note was copied from a baby's chart. Lactation Consultation Note  Patient Name: Girl Fae PippinLaquisha Gaubert EXBMW'UToday's Date: 01/24/2016 Reason for consult: Initial assessment   With this first time mom and early term baby, 37 5/[redacted] weeks gestation, weight just over 6 pounds. Mom reports breast feeding going well, bay latching easily, no discomfort for mom, and adequate wet and dirty diapers for baby. Hand expression reviewed with mom, lactation services reviewed, and basic breastfeeding teaching done from the [facility] book[facility], on breastfeeding and breast care. Mom very receptive to teaching. I advised her to call for help with latching positions, and to call with next latch for lactation to see. Mom knows to call for questions/conerns.    Maternal Data Formula Feeding for Exclusion: No Has patient been taught Hand Expression?: Yes Does the patient have breastfeeding experience prior to this delivery?: No  Feeding Feeding Type: Breast Fed Length of feed: 40 min  LATCH Score/Interventions    Audible Swallowing:  (drops of colostrum with hand expression)  Type of Nipple: Everted at rest and after stimulation  Comfort (Breast/Nipple): Soft / non-tender     Intervention(s): Breastfeeding basics reviewed;Support Pillows;Position options;Skin to skin     Lactation Tools Discussed/Used     Consult Status Consult Status: Follow-up Date: 01/25/16 Follow-up type: In-patient    Alfred LevinsLee, Jahsiah Carpenter Anne 01/24/2016, 2:51 PM

## 2016-01-25 MED ORDER — IBUPROFEN 100 MG/5ML PO SUSP: 600.0000 mg | Freq: Four times a day (QID) | ORAL | 2 refills | Status: AC

## 2016-01-25 NOTE — Progress Notes (Signed)
Post discharge chart review completed.  

## 2016-01-25 NOTE — Progress Notes (Signed)
Post Partum Day #1 Subjective: no complaints, up ad lib, voiding, tolerating PO and breast feeding is going well, desires discharge home today  Objective: Blood pressure 105/62, pulse 61, temperature 98 F (36.7 C), temperature source Oral, resp. rate 16, height 5\' 3"  (1.6 m), weight 162 lb (73.5 kg), last menstrual period 04/23/2015, unknown if currently breastfeeding.  Physical Exam:  General: alert, cooperative and no distress Lochia: appropriate Uterine Fundus: firm Incision: none DVT Evaluation: No evidence of DVT seen on physical exam. No cords or calf tenderness. No significant calf/ankle edema.   Recent Labs  01/23/16 1800  HGB 12.7  HCT 36.7    Assessment/Plan: Discharge home, Breastfeeding and Contraception OCPs planned postpartum   LOS: 2 days   Anna Bean, CNM 01/25/2016, 8:17 AM

## 2016-01-25 NOTE — Lactation Note (Signed)
This note was copied from a baby's chart. Lactation Consultation Note  Patient Name: Anna Bean Date: 01/25/2016 Reason for consult: Follow-up assessment Baby is 12 hours old, per mom baby has been  Breast feeding well , except during the night didn't seem satisfied and  I gave formula once and baby settled down. @ consult, LC changed a wet diaper, and assisted with latch / football / left breast/ And baby fed with depth and swallows for 7 mins and released. [place]o worked with mom to re-latch and baby didn't seem interested, fed for 5 mins.  Baby STS. Baby 5 % weight loss, Breast fed x 13 15 - 60 mins, supplemented x1 10 ml of formula, 8 wets , 4 stools, Latch score range - 6-9 , average 8's. Serum Bili at 25 hours - 5.6.  LC reviewed sore nipple and engorgement prevention and tx , instructed mom on the hand pump and cleaning. Also gave mom the DEBP kit.  [place] faxed a [facility] loaner request to [facility] and mom will plan to call today for an Appt. For tomorrow for DEBP . LC felt  Mom as ok for today , but when the  Milk comes in will need a strong B to review the milk if engorged or the baby only feeds on the 1st breast. Baby is an early term infant.  LC discussed with mom potential feeding behavior of an Early term infant. Also nutritive vs non - nutritive feeding pattern, and to watch for hanging out.  Mother informed of post-discharge support and given phone number to the lactation department, including services for phone call assistance; out-patient appointments; and breastfeeding support group. List of other breastfeeding resources in the community given in the handout. Encouraged mother to call for problems or concerns related to breastfeeding. Mom receptive to returning on 1/16 at 2:30 pm , LC O/P appt . At [facility], appt. Reminder given to mom.     Maternal Data    Feeding Feeding Type: Breast Fed Length of feed: 7 min  LATCH Score/Interventions Latch: Grasps breast easily, tongue  down, lips flanged, rhythmical sucking. Intervention(s): Adjust position;Assist with latch;Breast massage;Breast compression  Audible Swallowing: A few with stimulation  Type of Nipple: Everted at rest and after stimulation  Comfort (Breast/Nipple): Soft / non-tender     Hold (Positioning): Assistance needed to correctly position infant at breast and maintain latch. Intervention(s): Breastfeeding basics reviewed;Support Pillows;Position options;Skin to skin  LATCH Score: 8  Lactation Tools Discussed/Used     Consult Status Consult Status: Follow-up Date: 01/25/16    Myer Haff 01/25/2016, 1:18 PM

## 2016-01-25 NOTE — Clinical Social Work Maternal (Signed)
  CLINICAL SOCIAL WORK MATERNAL/CHILD NOTE  Patient Details  Name: Anna Bean MRN: [id number] Date of Birth: 1990-01-18  Date:  01/25/2016  Clinical Social Worker Initiating Note:  Laurey Arrow Date/ Time Initiated:  01/25/16/1117     Child's Name:  Filbert Berthold   Legal Guardian:  Mother (FOB is Elouise Munroe )   Need for Interpreter:  None   Date of Referral:  01/25/16     Reason for Referral:  Current Substance Use/Substance Use During Pregnancy  (Hx of THC use.)   Referral Source:  [employer]   Address:  [address] [place] [address]  Phone number:  [phone]   Household Members:  Self, Minor Children   Natural Supports (not living in the home):  Spouse/significant other, Parent (MOB will be supported by MOB's and FOB's extended and immediate family.)   Professional Supports: None   Employment: Animator   Type of Work: Investment banker, corporate   Education:  Database administrator Resources:  [employer]   Other Resources:  [employer], Physicist, medical    Cultural/Religious Considerations Which May Impact Care:  None Reported  Strengths:  Ability to meet basic needs , Lexicographer chosen , Home prepared for child    Risk Factors/Current Problems:  Substance Use    Cognitive State:  Alert , Able to Concentrate , Linear Thinking , Insightful    Mood/Affect:  Bright , Interested , Comfortable , Happy    CSW Assessment:  CSW met with CSW to complete an assessment for hx of THC use in pregnancy.  MOB was inviting and gave CSW permission to meet with MOB while FOB (Tyre Pearson) was present (asleep on the couch).  When CSW arrived MOB was engaging in skin to skin and appeared to be bonding with infant. CSW inquired about MOB's substance use and MOB acknowledged the use of marijuana prior to pregnancy confirmation.  MOB stated that MOB discontinued the use of all illegal substance once MOB pregnancy was confirmed. CSW thanked MOB for  sharing MOB's substance use information and informed MOB of the hospital's drug screen policy. CSW communicated to MOB that the infant's UDS was negative, and  CSW will continue to follow the infant's CDS.  CSW made MOB aware that if the infant has a positive CDS without explanation, CSW will make a report to [place].  MOB communicated MOB was not concerned, and denied utilizing any substance during pregnancy.  CSW offered MOB SA counseling and interventions and MOB declined all services. CSW thanked MOB for meeting with CSW and provided MOB with CSW contact information for future questions or concerns.                    CSW Plan/Description:  Information/Referral to [employer] , No Further Intervention Required/No Barriers to Discharge, Patient/Family Education  (CSW will monitor infant's CDS and will make a CPS report if warranted. )   Laurey Arrow, MSW, LCSW Clinical Social Work [phone]    Dimple Nanas, LCSW 01/25/2016, 11:20 AM

## 2016-01-25 NOTE — Discharge Summary (Signed)
Obstetric Discharge Summary Reason for Admission: onset of labor and at 3844w5d Prenatal Procedures: ultrasound Intrapartum Procedures: spontaneous vaginal delivery Postpartum Procedures: none Complications-Operative and Postpartum: none Hemoglobin  Date Value Ref Range Status  01/23/2016 12.7 12.0 - 15.0 g/dL Final   HCT  Date Value Ref Range Status  01/23/2016 36.7 36.0 - 46.0 % Final   Hematocrit  Date Value Ref Range Status  11/08/2015 33.0 (L) 34.0 - 46.6 % Final    Physical Exam:  General: alert, cooperative and no distress Lochia: appropriate Uterine Fundus: firm Incision: none DVT Evaluation: No evidence of DVT seen on physical exam. No cords or calf tenderness. No significant calf/ankle edema.  Discharge Diagnoses: Term Pregnancy-delivered  Discharge Information: Date: 01/25/2016 Activity: pelvic rest Diet: routine Medications: PNV and Ibuprofen Condition: stable Instructions: refer to practice specific booklet Discharge to: home Follow-up Information    Kameo Bains A Jamesyn Lindell, CNM Follow up in 4 week(s).   Specialty:  Certified Nurse Midwife Why:  Postpartum exam/contraception Contact information: [address] [place] [address] [phone]           Newborn Data: Live born female  Birth Weight: 6 lb 2.2 oz (2785 g) APGAR: 8, 9  Home with mother.  Roe Coombsachelle A Josiane Labine, CNM 01/25/2016, 8:20 AM

## 2016-01-29 ENCOUNTER — Encounter: Payer: 59 | Admitting: Obstetrics and Gynecology

## 2016-01-31 ENCOUNTER — Ambulatory Visit (HOSPITAL_COMMUNITY): Payer: No Typology Code available for payment source

## 2016-03-13 ENCOUNTER — Other Ambulatory Visit (HOSPITAL_COMMUNITY)
Admission: RE | Admit: 2016-03-13 | Discharge: 2016-03-13 | Disposition: A | Discharge status: Home / Self Care | Payer: Medicaid Other | Source: Ambulatory Visit | Attending: Certified Nurse Midwife | Admitting: Certified Nurse Midwife

## 2016-03-13 ENCOUNTER — Ambulatory Visit (INDEPENDENT_AMBULATORY_CARE_PROVIDER_SITE_OTHER): Payer: No Typology Code available for payment source | Admitting: Certified Nurse Midwife

## 2016-03-13 ENCOUNTER — Encounter: Payer: Self-pay | Admitting: Certified Nurse Midwife

## 2016-03-13 DIAGNOSIS — Z30011 Encounter for initial prescription of contraceptive pills: Secondary | ICD-10-CM

## 2016-03-13 DIAGNOSIS — Z113 Encounter for screening for infections with a predominantly sexual mode of transmission: Secondary | ICD-10-CM | POA: Insufficient documentation

## 2016-03-13 DIAGNOSIS — Z01419 Encounter for gynecological examination (general) (routine) without abnormal findings: Secondary | ICD-10-CM | POA: Diagnosis present

## 2016-03-13 MED ORDER — NORETHIN-ETH ESTRAD-FE BIPHAS 1 MG-10 MCG / 10 MCG PO TABS: 1.0000 | ORAL_TABLET | Freq: Every day | ORAL | 4 refills | Status: AC

## 2016-03-13 NOTE — Progress Notes (Signed)
Post Partum Exam  Anna Bean is a 26 y.o. 682P1011 female who presents for a postpartum visit. She is 6 weeks postpartum following a spontaneous vaginal delivery. I have fully reviewed the prenatal and intrapartum course. The delivery was at 37 gestational weeks.  Anesthesia: epidural. Postpartum course has been doing well. Baby's course has been doing well. Baby is feeding by bottle - Similac Alimentum. Bleeding no bleeding. Bowel function is normal. Bladder function is normal. Patient is not sexually active. Contraception method is abstinence. Pt would like birth control pill. Postpartum depression screening:neg, score 0.  The following portions of the patient's history were reviewed and updated as appropriate: allergies, current medications, past family history, past medical history, past social history, past surgical history and problem list.  Review of Systems Pertinent items noted in HPI and remainder of comprehensive ROS otherwise negative.    Objective:  unknown if currently breastfeeding.  General:  alert, cooperative, appears stated age and no distress   Breasts:  inspection negative, no nipple discharge or bleeding, no masses or nodularity palpable  Lungs: clear to auscultation bilaterally  Heart:  regular rate and rhythm, S1, S2 normal, no murmur, click, rub or gallop  Abdomen: soft, non-tender; bowel sounds normal; no masses,  no organomegaly   Vulva:  normal  Vagina: normal vagina, no discharge, exudate, lesion, or erythema  Cervix:  no bleeding following Pap and no cervical motion tenderness  Corpus: normal size, contour, position, consistency, mobility, non-tender  Adnexa:  normal adnexa  Rectal Exam: Not performed.        Assessment:    Normal 4 week postpartum exam. Pap smear done at today's visit.   Contraception management  Plan:   1. Contraception: abstinence 2. LoLo started in 2 weeks; ACHES reveiwed 3. Follow up in: 1 year/prn or as needed.

## 2016-03-15 LAB — CYTOLOGY - PAP: DIAGNOSIS: NEGATIVE

## 2016-03-18 ENCOUNTER — Other Ambulatory Visit: Payer: Self-pay | Admitting: Certified Nurse Midwife

## 2016-03-18 DIAGNOSIS — B9689 Other specified bacterial agents as the cause of diseases classified elsewhere: Secondary | ICD-10-CM

## 2016-03-18 DIAGNOSIS — N760 Acute vaginitis: Secondary | ICD-10-CM

## 2016-03-18 DIAGNOSIS — A749 Chlamydial infection, unspecified: Secondary | ICD-10-CM

## 2016-03-18 LAB — CERVICOVAGINAL ANCILLARY ONLY
Bacterial vaginitis: POSITIVE — AB
CANDIDA VAGINITIS: NEGATIVE
Chlamydia: POSITIVE — AB
Neisseria Gonorrhea: NEGATIVE
TRICH (WINDOWPATH): NEGATIVE

## 2016-03-18 MED ORDER — AZITHROMYCIN 250 MG PO TABS: ORAL_TABLET | ORAL | 0 refills | Status: AC

## 2016-03-18 MED ORDER — METRONIDAZOLE 500 MG PO TABS: 500.0000 mg | ORAL_TABLET | Freq: Two times a day (BID) | ORAL | 0 refills | Status: AC

## 2016-03-19 ENCOUNTER — Telehealth: Payer: Self-pay

## 2016-03-19 ENCOUNTER — Encounter: Payer: Self-pay | Admitting: *Deleted

## 2016-03-19 NOTE — Telephone Encounter (Signed)
Contacted patient and advised of results and rx, patient agreed and wanted to scheduled TOC appt 4-6 weeks. Transferred to scheduler.

## 2016-04-09 ENCOUNTER — Ambulatory Visit: Payer: No Typology Code available for payment source | Admitting: Obstetrics & Gynecology

## 2016-04-09 ENCOUNTER — Ambulatory Visit: Payer: No Typology Code available for payment source | Admitting: Certified Nurse Midwife
